# Patient Record
Sex: Female | Born: 1940 | Race: White | Hispanic: No | Marital: Married | Smoking: Current every day smoker
Health system: Southern US, Community
[De-identification: ages and names within clinical notes are randomized; demographics above are authoritative.]

## PROBLEM LIST (undated history)

## (undated) DIAGNOSIS — F419 Anxiety disorder, unspecified: Secondary | ICD-10-CM

## (undated) DIAGNOSIS — I10 Essential (primary) hypertension: Secondary | ICD-10-CM

## (undated) DIAGNOSIS — C719 Malignant neoplasm of brain, unspecified: Secondary | ICD-10-CM

## (undated) HISTORY — PX: ABDOMINAL HYSTERECTOMY: SHX81

---

## 2004-11-19 ENCOUNTER — Ambulatory Visit: Payer: Self-pay

## 2005-01-25 ENCOUNTER — Emergency Department: Payer: Self-pay | Admitting: Emergency Medicine

## 2005-09-07 ENCOUNTER — Ambulatory Visit: Payer: Self-pay | Admitting: Family Medicine

## 2005-12-24 ENCOUNTER — Ambulatory Visit: Payer: Self-pay | Admitting: Family Medicine

## 2006-09-09 ENCOUNTER — Other Ambulatory Visit: Payer: Self-pay

## 2006-09-09 ENCOUNTER — Emergency Department: Payer: Self-pay

## 2007-01-16 ENCOUNTER — Emergency Department: Payer: Self-pay | Admitting: Emergency Medicine

## 2007-07-24 ENCOUNTER — Other Ambulatory Visit: Payer: Self-pay

## 2007-07-24 ENCOUNTER — Observation Stay: Payer: Self-pay | Admitting: Internal Medicine

## 2007-10-30 ENCOUNTER — Emergency Department: Payer: Self-pay

## 2007-11-18 ENCOUNTER — Ambulatory Visit: Payer: Self-pay | Admitting: Family Medicine

## 2008-03-07 ENCOUNTER — Ambulatory Visit: Payer: Self-pay | Admitting: Gastroenterology

## 2008-03-25 ENCOUNTER — Emergency Department: Payer: Self-pay | Admitting: Emergency Medicine

## 2008-09-13 ENCOUNTER — Ambulatory Visit: Payer: Self-pay | Admitting: Rheumatology

## 2008-09-18 ENCOUNTER — Ambulatory Visit: Payer: Self-pay | Admitting: Internal Medicine

## 2008-10-05 ENCOUNTER — Ambulatory Visit: Payer: Self-pay | Admitting: Internal Medicine

## 2008-10-09 ENCOUNTER — Ambulatory Visit: Payer: Self-pay | Admitting: Internal Medicine

## 2008-10-18 ENCOUNTER — Ambulatory Visit: Payer: Self-pay | Admitting: Internal Medicine

## 2008-11-06 ENCOUNTER — Ambulatory Visit: Payer: Self-pay | Admitting: Cardiothoracic Surgery

## 2008-11-18 ENCOUNTER — Ambulatory Visit: Payer: Self-pay | Admitting: Internal Medicine

## 2008-12-19 ENCOUNTER — Ambulatory Visit: Payer: Self-pay | Admitting: Internal Medicine

## 2009-01-06 ENCOUNTER — Emergency Department: Payer: Self-pay | Admitting: Emergency Medicine

## 2009-06-11 ENCOUNTER — Ambulatory Visit: Payer: Self-pay | Admitting: Cardiothoracic Surgery

## 2009-06-12 ENCOUNTER — Ambulatory Visit: Payer: Self-pay | Admitting: Internal Medicine

## 2010-04-20 ENCOUNTER — Emergency Department: Payer: Self-pay | Admitting: Emergency Medicine

## 2011-01-18 ENCOUNTER — Emergency Department: Payer: Self-pay | Admitting: Emergency Medicine

## 2011-01-22 ENCOUNTER — Ambulatory Visit: Payer: Self-pay | Admitting: Family Medicine

## 2012-05-12 ENCOUNTER — Emergency Department: Payer: Self-pay | Admitting: Emergency Medicine

## 2012-11-28 ENCOUNTER — Emergency Department: Payer: Self-pay | Admitting: Emergency Medicine

## 2013-05-06 ENCOUNTER — Observation Stay: Payer: Self-pay | Admitting: Internal Medicine

## 2013-05-06 LAB — BASIC METABOLIC PANEL
ANION GAP: 6 — AB (ref 7–16)
BUN: 18 mg/dL (ref 7–18)
CREATININE: 0.78 mg/dL (ref 0.60–1.30)
Calcium, Total: 9.6 mg/dL (ref 8.5–10.1)
Chloride: 104 mmol/L (ref 98–107)
Co2: 28 mmol/L (ref 21–32)
EGFR (Non-African Amer.): >60
Glucose: 142 mg/dL — ABNORMAL HIGH (ref 65–99)
Osmolality: 280 (ref 275–301)
Potassium: 3.6 mmol/L (ref 3.5–5.1)
Sodium: 138 mmol/L (ref 136–145)

## 2013-05-06 LAB — TROPONIN I
Troponin-I: <0.02 ng/mL
Troponin-I: <0.02 ng/mL

## 2013-05-06 LAB — CBC
HCT: 38.4 % (ref 35.0–47.0)
HGB: 12.6 g/dL (ref 12.0–16.0)
MCH: 28.6 pg (ref 26.0–34.0)
MCHC: 32.9 g/dL (ref 32.0–36.0)
MCV: 87 fL (ref 80–100)
Platelet: 276 10*3/uL (ref 150–440)
RBC: 4.42 10*6/uL (ref 3.80–5.20)
RDW: 15.1 % — ABNORMAL HIGH (ref 11.5–14.5)
WBC: 9.8 10*3/uL (ref 3.6–11.0)

## 2013-05-07 LAB — LIPID PANEL
Cholesterol: 168 mg/dL (ref 0–200)
HDL Cholesterol: 45 mg/dL (ref 40–60)
Ldl Cholesterol, Calc: 98 mg/dL (ref 0–100)
TRIGLYCERIDES: 126 mg/dL (ref 0–200)
VLDL CHOLESTEROL, CALC: 25 mg/dL (ref 5–40)

## 2013-05-07 LAB — TSH: THYROID STIMULATING HORM: 2.75 u[IU]/mL

## 2013-11-22 ENCOUNTER — Emergency Department: Payer: Self-pay | Admitting: Emergency Medicine

## 2013-11-22 LAB — BASIC METABOLIC PANEL
ANION GAP: 7 (ref 7–16)
BUN: 17 mg/dL (ref 7–18)
CO2: 27 mmol/L (ref 21–32)
Calcium, Total: 9.1 mg/dL (ref 8.5–10.1)
Chloride: 108 mmol/L — ABNORMAL HIGH (ref 98–107)
Creatinine: 0.69 mg/dL (ref 0.60–1.30)
EGFR (African American): >60
EGFR (Non-African Amer.): >60
Glucose: 88 mg/dL (ref 65–99)
Osmolality: 284 (ref 275–301)
Potassium: 3.6 mmol/L (ref 3.5–5.1)
SODIUM: 142 mmol/L (ref 136–145)

## 2013-11-22 LAB — CBC
HCT: 39.2 % (ref 35.0–47.0)
HGB: 12.4 g/dL (ref 12.0–16.0)
MCH: 29.2 pg (ref 26.0–34.0)
MCHC: 31.8 g/dL — ABNORMAL LOW (ref 32.0–36.0)
MCV: 92 fL (ref 80–100)
Platelet: 223 10*3/uL (ref 150–440)
RBC: 4.26 10*6/uL (ref 3.80–5.20)
RDW: 14.8 % — AB (ref 11.5–14.5)
WBC: 7.7 10*3/uL (ref 3.6–11.0)

## 2013-11-22 LAB — TROPONIN I: Troponin-I: <0.02 ng/mL

## 2013-11-23 LAB — TROPONIN I: Troponin-I: <0.02 ng/mL

## 2014-03-24 ENCOUNTER — Emergency Department: Payer: Self-pay | Admitting: Emergency Medicine

## 2014-04-29 ENCOUNTER — Emergency Department: Payer: Self-pay | Admitting: Emergency Medicine

## 2014-04-29 LAB — CBC
HCT: 35.2 % (ref 35.0–47.0)
HGB: 11.4 g/dL — AB (ref 12.0–16.0)
MCH: 30.2 pg (ref 26.0–34.0)
MCHC: 32.5 g/dL (ref 32.0–36.0)
MCV: 93 fL (ref 80–100)
Platelet: 265 10*3/uL (ref 150–440)
RBC: 3.78 10*6/uL — ABNORMAL LOW (ref 3.80–5.20)
RDW: 13.3 % (ref 11.5–14.5)
WBC: 6.4 10*3/uL (ref 3.6–11.0)

## 2014-04-29 LAB — COMPREHENSIVE METABOLIC PANEL
ALBUMIN: 3.6 g/dL (ref 3.4–5.0)
ANION GAP: 7 (ref 7–16)
Alkaline Phosphatase: 48 U/L
BILIRUBIN TOTAL: 0.2 mg/dL (ref 0.2–1.0)
BUN: 16 mg/dL (ref 7–18)
CALCIUM: 9.3 mg/dL (ref 8.5–10.1)
CREATININE: 0.61 mg/dL (ref 0.60–1.30)
Chloride: 106 mmol/L (ref 98–107)
Co2: 29 mmol/L (ref 21–32)
EGFR (African American): >60
EGFR (Non-African Amer.): >60
GLUCOSE: 117 mg/dL — AB (ref 65–99)
OSMOLALITY: 285 (ref 275–301)
POTASSIUM: 4 mmol/L (ref 3.5–5.1)
SGOT(AST): 28 U/L (ref 15–37)
SGPT (ALT): 16 U/L
SODIUM: 142 mmol/L (ref 136–145)
TOTAL PROTEIN: 7.2 g/dL (ref 6.4–8.2)

## 2014-04-29 LAB — LIPASE, BLOOD: LIPASE: 54 U/L — AB (ref 73–393)

## 2014-04-29 LAB — URINALYSIS, COMPLETE
Bacteria: NONE SEEN
Bilirubin,UR: NEGATIVE
Blood: NEGATIVE
Glucose,UR: NEGATIVE mg/dL (ref 0–75)
KETONE: NEGATIVE
Leukocyte Esterase: NEGATIVE
Nitrite: NEGATIVE
PROTEIN: NEGATIVE
Ph: 6 (ref 4.5–8.0)
RBC,UR: 1 /HPF (ref 0–5)
SPECIFIC GRAVITY: 1.014 (ref 1.003–1.030)
Squamous Epithelial: <1

## 2014-04-29 LAB — TROPONIN I

## 2014-07-02 ENCOUNTER — Ambulatory Visit: Payer: Self-pay | Admitting: Ophthalmology

## 2014-08-11 NOTE — Discharge Summary (Signed)
PATIENT NAME:  Sheena Holloway, Sheena Holloway MR#:  [id number] DATE OF BIRTH:  Oct 07, 1940  DATE OF ADMISSION:  05/06/2013 DATE OF DISCHARGE:  05/07/2013  DISCHARGE DIAGNOSES: 1.    Chest pain, troponin and telemetry, ruled out coronary artery disease.  2.   Patient felt nerve root compression and frequent pains in the back. Advised to see orthopedic doctor.  3.   Hypertension.   CONDITION ON DISCHARGE: Stable.   CODE STATUS: Full code.   MEDICATIONS: Atenolol 50 mg once a day.   DIET: Advised to have low-sodium, low-cholesterol diet on discharge:   ACTIVITY: As tolerated.   FOLLOWUP: Follow up within 2 to 4 weeks; see  primary care physician, Dr. Delight Stare.  HISTORY OF PRESENT ILLNESS: A 74 year old Caucasian female with history of anxiety, depression, hypertension, kidney stones, presented to Emergency Room with chest pain. She had her pain radiation to the left side of her chest, intermittent 5 out of 10. She denied any nausea, vomiting, diaphoresis, no palpitations, orthopnea, nocturnal dyspnea or no leg edema. Her EKG showed T-wave inversions and so admitted for observation.   HOSPITAL COURSE AND STAY:  She remained pain-free in the hospital with some dull pain, which was radiating from her back towards the front of the chest and she said that she had pinched nerve and this type of pain was chronic to her. She was scared on the previous day and came to the Emergency Room. Now, she was comfortable on the next day during my examination. Her troponin remained negative x 3 and on telemetry there was no event, so I advised her to follow with orthopedic doctor for her pinched nerve pain and continue her atenolol for her hypertension. Smoking cessation counseling was also done.    IMPORTANT LAB RESULTS IN THE HOSPITAL:  Troponin remained less than 0.02 x 3.  Hemoglobin was 12.6, creatinine was 0.78.   Total time spent on this discharge:  40 minutes.   ____________________________ Ceasar Lund  Anselm Jungling, MD vgv:NTS D: 05/11/2013 76:28:31 ET T: 05/12/2013 01:19:03 ET JOB#: [id number]  cc: Ceasar Lund. Anselm Jungling, MD, <Dictator> Marguerita Merles, MD Vaughan Basta MD ELECTRONICALLY SIGNED 05/12/2013 18:55

## 2014-08-11 NOTE — H&P (Signed)
PATIENT NAME:  Sheena Holloway, NAZARIO MR#:  [id number] DATE OF BIRTH:  November 06, 1940  DATE OF ADMISSION:  05/06/2013  PRIMARY CARE PHYSICIAN:  Dr. Lennox Grumbles.  REFERRING PHYSICIAN:  Dr. Joni Fears.  CHIEF COMPLAINT:  Chest pain, one day.   HISTORY OF PRESENT ILLNESS:  A 74 year old Caucasian female with a history of anxiety, depression, hypertension, kidney stone presented in the ED with chest pain one day.  Actually, the patient has chest pain for a long time, but worsening for the past one day.  The patient said her chest pain is coming from the back.  She said that she has back pain radiation to the left side of the chest, intermittent, 5 out of 10, but the patient denies any nausea, vomiting, diaphoresis.  No palpitations, orthopnea, orthopnea nocturnal dyspnea.  No leg edema.  The patient's EKG showed T wave inversions.  Dr. Joni Fears admitted the patient for observation.  The patient denies any other symptoms.   PAST MEDICAL HISTORY:  Hypertension, kidney stone, anxiety, depression.   SOCIAL HISTORY:  Smokes 1/2 pack a day to 1 pack a day for 40 years.  Denies any alcohol drinking or illicit drugs.   PAST SURGICAL HISTORY:  Thymoma surgery, hysterectomy.   FAMILY HISTORY:  Hypertension.   ALLERGIES:  No.   HOME MEDICATIONS:   Atenolol 50 mg by mouth daily.   REVIEW OF SYSTEMS:  CONSTITUTIONAL:  The patient denies any fever or chills.  No headache or dizziness.  No weakness.   EYES:  No double vision, blurred vision.  EARS, NOSE, THROAT:  No postnasal drip, slurred speech or dysphagia.  CARDIOVASCULAR:  Positive for chest pain on the left side.  No palpitation, orthopnea, nocturnal dyspnea.  No leg edema.  PULMONARY:  No cough, sputum, shortness of breath or hemoptysis.  GASTROINTESTINAL:  No abdominal pain, nausea, vomiting or diarrhea.  No melena or bloody stool.  GENITOURINARY:  No dysuria, hematuria, or incontinence.  SKIN:  No rash or jaundice.  NEUROLOGIC:  No syncope, loss of  consciousness or seizure.  ENDOCRINE:  No polyuria, polydipsia, heat or cold intolerance.  HEMATOLOGY:  No easy bruising or bleeding.   PHYSICAL EXAMINATION: VITAL SIGNS:  Temperature 97.7, blood pressure 140/62, pulse 58, respirations 18, O2 saturation 100% on room air.  GENERAL:  The patient is alert, awake, oriented, in no acute distress.  HEENT:  Pupils round, equal and reactive to light and accommodation.   NECK:  Supple.  No JVD or carotid bruit.  No lymphadenopathy.  No thyromegaly.  CARDIOVASCULAR:  S1, S2, regular rate and rhythm.  No murmurs or gallops.  PULMONARY:  Bilateral air entry.  No wheezing or rales.  No use of accessory muscle to breathe.  Chest wall tenderness on the left side.  ABDOMEN:  Soft.  No distention.  No tenderness.  No organomegaly.  Bowel sounds present.  EXTREMITIES:  No edema, clubbing or cyanosis.  No calf tenderness.  SKIN:  No rash or jaundice.  NEUROLOGIC:  A and O x 3.  No focal deficit.  Power 5 out of 5.  Sensation intact.   LABORATORY DATA:  Troponin less than 0.02.  CBC in normal range.  Glucose 142, BUN 18, creatinine 0.78.  Electrolytes are normal.  Chest x-ray showed no evidence of pneumonia or CHF.  There was hyperinflation consistent with COPD.   IMPRESSION: 1.  Atypical chest pain, possibly due to musculoskeletal etiology.  2.  Hypertension.  3.  Abnormal EKG.  4.  Tobacco abuse.  PLAN OF TREATMENT: 1.  The patient will be placed for observation.  We will continue telemonitor, follow up a troponin level, lipid panel, TSH.  We will get a cardiology consult.  2.  We will continue atenolol.  3.  Smoking cessation was counseled.  We will give a nicotine patch and discuss the patient's condition and plan of treatment with the patient.  4.  THE PATIENT WANTS FULL CODE.   TIME SPENT:  About 52 minutes.    ____________________________ Demetrios Loll, MD qc:ea D: 05/06/2013 16:45:15 ET T: 05/06/2013 17:23:22 ET JOB#: [id number]  cc: Demetrios Loll,  MD, <Dictator> Demetrios Loll MD ELECTRONICALLY SIGNED 05/07/2013 10:44

## 2014-08-13 ENCOUNTER — Ambulatory Visit
Admit: 2014-08-13 | Disposition: A | Discharge status: Home / Self Care | Payer: Self-pay | Attending: Ophthalmology | Admitting: Ophthalmology

## 2014-08-18 ENCOUNTER — Emergency Department
Admit: 2014-08-18 | Disposition: A | Discharge status: Home / Self Care | Payer: Self-pay | Admitting: Emergency Medicine

## 2014-11-23 ENCOUNTER — Ambulatory Visit: Payer: Self-pay | Admitting: Family Medicine

## 2015-04-06 ENCOUNTER — Ambulatory Visit
Admission: EM | Admit: 2015-04-06 | Discharge: 2015-04-06 | Disposition: A | Discharge status: Home / Self Care | Payer: Medicaid Other | Attending: Internal Medicine | Admitting: Internal Medicine

## 2015-04-06 DIAGNOSIS — L508 Other urticaria: Secondary | ICD-10-CM

## 2015-04-06 HISTORY — DX: Anxiety disorder, unspecified: F41.9

## 2015-04-06 HISTORY — DX: Essential (primary) hypertension: I10

## 2015-04-06 MED ORDER — PREDNISONE 20 MG PO TABS: 20.0000 mg | ORAL_TABLET | Freq: Every day | ORAL | Status: DC

## 2015-04-06 NOTE — Discharge Instructions (Signed)
Prescription for prednisone sent to [facility]. Try taking a walk every day, to manage adrenaline.  Follow-up with your PCP, Dr. Lennox Grumbles, if not starting to improve in a week or 2.  Sometimes an allergist is helpful in managing chronic hives.  Hives Hives are itchy, red, puffy (swollen) areas of the skin. Hives can change in size and location on your body. Hives can come and go for hours, days, or weeks. Hives do not spread from person to person (noncontagious). Scratching, exercise, and stress can make your hives worse. HOME CARE  Avoid things that cause your hives (triggers).  Take antihistamine medicines as told by your doctor. Do not drive while taking an antihistamine.  Take any other medicines for itching as told by your doctor.  Wear loose-fitting clothing.  Keep all doctor visits as told. GET HELP RIGHT AWAY IF:   You have a fever.  Your tongue or lips are puffy.  You have trouble breathing or swallowing.  You feel tightness in the throat or chest.  You have belly (abdominal) pain.  You have lasting or severe itching that is not helped by medicine.  You have painful or puffy joints. These problems may be the first sign of a life-threatening allergic reaction. Call your local emergency services (911 in [place] MAKE SURE YOU:   Understand these instructions.  Will watch your condition.  Will get help right away if you are not doing well or get worse.   This information is not intended to replace advice given to you by your health care provider. Make sure you discuss any questions you have with your health care provider.   Document Released: 01/14/2008 Document Revised: 10/06/2011 Document Reviewed: 06/30/2011 Elsevier Interactive Patient Education [employer].

## 2015-04-06 NOTE — ED Provider Notes (Signed)
CSN: [id number]     Arrival date & time 04/06/15  1043 History   First MD Initiated Contact with Patient 04/06/15 1142     Chief Complaint  Patient presents with  . Urticaria    Pt reports hives "all over body" off and on x months. Has been seeing her Dr. for this but nothing has worked so far to keep them away. Unsure of allergen. Under a lot of stress.    Patient is a 74 y.o. female presenting with urticaria.  Urticaria   patient is had hives for about a month, intermittently. She is under a lot of stress taking care of her sick husband, he has cancer. She is not able to drive anymore, due to vision. She has not been walking, which used to help with stress for her. She feels like her nerves are definitely inflaming the hives. She has had one course of oral steroids, which was helpful. No respiratory symptoms, no GI distress. No fever.  Past Medical History  Diagnosis Date  . Hypertension   . Anxiety    History reviewed. No pertinent past surgical history. History reviewed. No pertinent family history. Social History  Substance Use Topics  . Smoking status: Current Every Day Smoker -- 0.50 packs/day    Types: Cigarettes  . Smokeless tobacco: None  . Alcohol Use: No    Review of Systems  All other systems reviewed and are negative.   Allergies  Review of patient's allergies indicates no known allergies.  Home Medications   Prior to Admission medications   Medication Sig Start Date End Date Taking? Authorizing Provider  LORazepam (ATIVAN) 1 MG tablet Take 1 mg by mouth at bedtime.   Yes Historical Provider, MD  metoprolol (LOPRESSOR) 50 MG tablet Take 50 mg by mouth 2 (two) times daily.   Yes Historical Provider, MD           BP 166/71 mmHg  Pulse 56  Temp(Src) 98 F (36.7 C) (Oral)  Resp 18  Ht 5\' 5"  (1.651 m)  Wt 124 lb (56.246 kg)  BMI 20.63 kg/m2  SpO2 97%   Physical Exam  Constitutional: She is oriented to person, place, and time. No distress.  Alert,  nicely groomed Scratching during exam, several sites, elbows, low back  HENT:  Head: Atraumatic.  No facial swelling  Eyes:  Conjugate gaze, no eye redness/drainage  Neck: Neck supple.  Cardiovascular: Regular rhythm.   Heart rate 50s on exam  Pulmonary/Chest: No respiratory distress.  Lungs clear, symmetric breath sounds  Abdominal: She exhibits no distension.  Musculoskeletal: Normal range of motion.  No leg swelling  Neurological: She is alert and oriented to person, place, and time.  Skin: Skin is warm and dry.  No cyanosis Several large urticarial wheals and plaques over posterior arms, mid to low back, and legs. No respiratory distress, not coughing  Nursing note and vitals reviewed.   ED Course  Procedures (including critical care time)  none   MDM   1. Urticaria, acute    Discharge Medication List as of 04/06/2015 12:03 PM    START taking these medications   Details  predniSONE (DELTASONE) 20 MG tablet Take 1 tablet (20 mg total) by mouth daily. 3 tabs qd x 3d then 2 tabs qd x 3d then 1 tab qd x 3d then 0.5 tab qd x 4d then stop., Starting 04/06/2015, Until Discontinued, Normal           Sherlene Shams, MD 04/10/15 1240

## 2015-11-05 ENCOUNTER — Encounter: Payer: Self-pay | Admitting: Emergency Medicine

## 2015-11-05 ENCOUNTER — Ambulatory Visit
Admission: EM | Admit: 2015-11-05 | Discharge: 2015-11-05 | Disposition: A | Discharge status: Home / Self Care | Payer: Medicare Other | Attending: Family Medicine | Admitting: Family Medicine

## 2015-11-05 DIAGNOSIS — F430 Acute stress reaction: Secondary | ICD-10-CM | POA: Diagnosis not present

## 2015-11-05 DIAGNOSIS — F32A Depression, unspecified: Secondary | ICD-10-CM

## 2015-11-05 DIAGNOSIS — F4320 Adjustment disorder, unspecified: Secondary | ICD-10-CM

## 2015-11-05 DIAGNOSIS — F329 Major depressive disorder, single episode, unspecified: Secondary | ICD-10-CM

## 2015-11-05 DIAGNOSIS — H3530 Unspecified macular degeneration: Secondary | ICD-10-CM | POA: Diagnosis not present

## 2015-11-05 MED ORDER — ESCITALOPRAM OXALATE 10 MG PO TABS: 10.0000 mg | ORAL_TABLET | Freq: Every day | ORAL | Status: DC

## 2015-11-05 NOTE — ED Provider Notes (Signed)
CSN: [id number]     Arrival date & time 11/05/15  1208 History   First MD Initiated Contact with Patient 11/05/15 1240   Nurses notes were reviewed.  Chief Complaint  Patient presents with  . Eye Problem   Patient came in complaining about eye problems. She has a history of macular degeneration and she is legally blind in the left eye. The right eye is 20/50 so she is not qualified to get a driver's license. She does wear glasses and is her inability to drive that is causing the problem. She reports that she is forced to drive with her husband who she feels has impairment problems with hearing. He has a history of cancer and she states she just doesn't care he drives. She states that her foster when she needs to call with him and that because of his hearing impairment at home she is basically isolated. She reports because of his unsociable tendencies she has very little interactions with people or other people coming to visit her. Because of her limitations and driving she's unable to access other people. She used to drive and is frustrated because she can't drive anymore. She states she has appointment with eye doctor next week and she is going to try them for them to try to get her condition improved so that she can drive. He states she's gotten injections in the left eye and fortunately it is only the left side has macular degeneration.  She reports not want herself but times having crying spells she wakes up very easily and does feel depressed. She reports that her PCP does not want her on medication and she is asked to be put on something to help with her nerves. She feels reasonably she does not feel good is because of her nerves.  She does smoke she has a history of hypertension anxiety she's had abdominal hysterectomy before. No pertinent family medical history pertinent to today's visit.    (Consider location/radiation/quality/duration/timing/severity/associated sxs/prior Treatment) Patient  is a 75 y.o. female presenting with eye problem and depression. The history is provided by the patient. No language interpreter was used.  Eye Problem Severity:  Moderate Timing:  Constant Progression:  Unchanged Chronicity:  Chronic Context: not burn, not chemical exposure, not direct trauma, not foreign body and not scratch   Worsened by:  Nothing tried Associated symptoms: no headaches   Depression This is a chronic problem. The problem occurs constantly. The problem has not changed since onset.Pertinent negatives include no chest pain, no abdominal pain, no headaches and no shortness of breath. Nothing aggravates the symptoms. Nothing relieves the symptoms. She has tried nothing for the symptoms. The treatment provided no relief.    Past Medical History  Diagnosis Date  . Hypertension   . Anxiety    Past Surgical History  Procedure Laterality Date  . Abdominal hysterectomy     History reviewed. No pertinent family history. Social History  Substance Use Topics  . Smoking status: Current Every Day Smoker -- 0.50 packs/day    Types: Cigarettes  . Smokeless tobacco: None  . Alcohol Use: No   OB History    No data available     Review of Systems  Respiratory: Negative for shortness of breath.   Cardiovascular: Negative for chest pain.  Gastrointestinal: Negative for abdominal pain.  Neurological: Negative for headaches.  Psychiatric/Behavioral: Positive for depression.  All other systems reviewed and are negative.   Allergies  Review of patient's allergies indicates no known allergies.  Home Medications   Prior to Admission medications   Medication Sig Start Date End Date Taking? Authorizing Provider  escitalopram (LEXAPRO) 10 MG tablet Take 1 tablet (10 mg total) by mouth daily. Start off with half a tablet for 1 week and then increase to a whole tablet daily 11/05/15   Frederich Cha, MD  LORazepam (ATIVAN) 1 MG tablet Take 1 mg by mouth at bedtime.    Historical  Provider, MD  metoprolol (LOPRESSOR) 50 MG tablet Take 50 mg by mouth 2 (two) times daily.    Historical Provider, MD  predniSONE (DELTASONE) 20 MG tablet Take 1 tablet (20 mg total) by mouth daily. 3 tabs qd x 3d then 2 tabs qd x 3d then 1 tab qd x 3d then 0.5 tab qd x 4d then stop. 04/06/15   Sherlene Shams, MD   Meds Ordered and Administered this Visit  Medications - No data to display  BP 162/50 mmHg  Pulse 66  Temp(Src) 97.2 F (36.2 C) (Tympanic)  Resp 16  Ht 5\' 4"  (1.626 m)  Wt 110 lb (49.896 kg)  BMI 18.87 kg/m2  SpO2 99% No data found.   Physical Exam  Constitutional: She appears cachectic.  Non-toxic appearance. She does not have a sickly appearance. She does not appear ill.  HENT:  Head: Normocephalic.  Eyes: Conjunctivae are normal. Pupils are equal, round, and reactive to light.  Neck: Normal range of motion.  Cardiovascular: Normal rate and regular rhythm.   Pulmonary/Chest: Effort normal and breath sounds normal.  Musculoskeletal: Normal range of motion.  Neurological: She is alert.  Skin: Skin is warm and dry.  Psychiatric: Her mood appears anxious.  Vitals reviewed.   ED Course  Procedures (including critical care time)  Labs Review Labs Reviewed - No data to display  Imaging Review No results found.   Visual Acuity Review  Right Eye Distance: 20/50 corrected Left Eye Distance: 20/200 corrected Bilateral Distance:    Right Eye Near:   Left Eye Near:    Bilateral Near:         MDM   1. Depression   2. Macular degeneration, left eye   3. Adult situational stress disorder     Since everything appears be related to the stress that she's under I've recommended that she try Lexapro since may help for appears be a depressive situation.   Discussed with patient will try her on Lexapro 10 mg half a tablet for week and if she tolerates that I recommend she go to a whole tablet daily and follow her PCP in about 4-6 weeks for is maintaining the  Lexapro medication if this helpful.  Note: This dictation was prepared with Dragon dictation along with smaller phrase technology. Any transcriptional errors that result from this process are unintentional.     Frederich Cha, MD 11/05/15 1359

## 2015-11-05 NOTE — Discharge Instructions (Signed)
Major Depressive Disorder Major depressive disorder is a mental illness. It also may be called clinical depression or unipolar depression. Major depressive disorder usually causes feelings of sadness, hopelessness, or helplessness. Some people with this disorder do not feel particularly sad but lose interest in doing things they used to enjoy (anhedonia). Major depressive disorder also can cause physical symptoms. It can interfere with work, school, relationships, and other normal everyday activities. The disorder varies in severity but is longer lasting and more serious than the sadness we all feel from time to time in our lives. Major depressive disorder often is triggered by stressful life events or major life changes. Examples of these triggers include divorce, loss of your job or home, a move, and the death of a family member or close friend. Sometimes this disorder occurs for no obvious reason at all. People who have family members with major depressive disorder or bipolar disorder are at higher risk for developing this disorder, with or without life stressors. Major depressive disorder can occur at any age. It may occur just once in your life (single episode major depressive disorder). It may occur multiple times (recurrent major depressive disorder). SYMPTOMS People with major depressive disorder have either anhedonia or depressed mood on nearly a daily basis for at least 2 weeks or longer. Symptoms of depressed mood include:  Feelings of sadness (blue or down in the dumps) or emptiness.  Feelings of hopelessness or helplessness.  Tearfulness or episodes of crying (may be observed by others).  Irritability (children and adolescents). In addition to depressed mood or anhedonia or both, people with this disorder have at least four of the following symptoms:  Difficulty sleeping or sleeping too much.   Significant change (increase or decrease) in appetite or weight.   Lack of energy or  motivation.  Feelings of guilt and worthlessness.   Difficulty concentrating, remembering, or making decisions.  Unusually slow movement (psychomotor retardation) or restlessness (as observed by others).   Recurrent wishes for death, recurrent thoughts of self-harm (suicide), or a suicide attempt. People with major depressive disorder commonly have persistent negative thoughts about themselves, other people, and the world. People with severe major depressive disorder may experiencedistorted beliefs or perceptions about the world (psychotic delusions). They also may see or hear things that are not real (psychotic hallucinations). DIAGNOSIS Major depressive disorder is diagnosed through an assessment by your health care provider. Your health care provider will ask aboutaspects of your daily life, such as mood,sleep, and appetite, to see if you have the diagnostic symptoms of major depressive disorder. Your health care provider may ask about your medical history and use of alcohol or drugs, including prescription medicines. Your health care provider also may do a physical exam and blood work. This is because certain medical conditions and the use of certain substances can cause major depressive disorder-like symptoms (secondary depression). Your health care provider also may refer you to a mental health specialist for further evaluation and treatment. TREATMENT It is important to recognize the symptoms of major depressive disorder and seek treatment. The following treatments can be prescribed for this disorder:   Medicine. Antidepressant medicines usually are prescribed. Antidepressant medicines are thought to correct chemical imbalances in the brain that are commonly associated with major depressive disorder. Other types of medicine may be added if the symptoms do not respond to antidepressant medicines alone or if psychotic delusions or hallucinations occur.  Talk therapy. Talk therapy can be  helpful in treating major depressive disorder by providing   support, education, and guidance. Certain types of talk therapy also can help with negative thinking (cognitive behavioral therapy) and with relationship issues that trigger this disorder (interpersonal therapy). A mental health specialist can help determine which treatment is best for you. Most people with major depressive disorder do well with a combination of medicine and talk therapy. Treatments involving electrical stimulation of the brain can be used in situations with extremely severe symptoms or when medicine and talk therapy do not work over time. These treatments include electroconvulsive therapy, transcranial magnetic stimulation, and vagal nerve stimulation.   This information is not intended to replace advice given to you by your health care provider. Make sure you discuss any questions you have with your health care provider.   Document Released: 08/01/2012 Document Revised: 04/27/2014 Document Reviewed: 08/01/2012 Elsevier Interactive Patient Education 2016 [employer]  

## 2015-11-05 NOTE — ED Notes (Signed)
Patient c/o blurry vision that started yesterday.  Patient reports ongoing HAs.

## 2015-12-22 ENCOUNTER — Encounter: Payer: Self-pay | Admitting: Emergency Medicine

## 2015-12-22 ENCOUNTER — Encounter: Payer: Self-pay | Admitting: *Deleted

## 2015-12-22 ENCOUNTER — Emergency Department: Payer: Medicare Other

## 2015-12-22 ENCOUNTER — Observation Stay
Admission: EM | Admit: 2015-12-22 | Discharge: 2015-12-23 | Disposition: A | Discharge status: Home / Self Care | Payer: Medicare Other | Attending: Internal Medicine | Admitting: Internal Medicine

## 2015-12-22 ENCOUNTER — Emergency Department
Admission: EM | Admit: 2015-12-22 | Discharge: 2015-12-22 | Discharge status: Against Medical Advice | Payer: Medicare Other | Source: Home / Self Care | Attending: Emergency Medicine | Admitting: Emergency Medicine

## 2015-12-22 DIAGNOSIS — R51 Headache: Principal | ICD-10-CM

## 2015-12-22 DIAGNOSIS — R233 Spontaneous ecchymoses: Secondary | ICD-10-CM

## 2015-12-22 DIAGNOSIS — I672 Cerebral atherosclerosis: Secondary | ICD-10-CM | POA: Diagnosis not present

## 2015-12-22 DIAGNOSIS — F17210 Nicotine dependence, cigarettes, uncomplicated: Secondary | ICD-10-CM

## 2015-12-22 DIAGNOSIS — Z9841 Cataract extraction status, right eye: Secondary | ICD-10-CM | POA: Insufficient documentation

## 2015-12-22 DIAGNOSIS — R519 Headache, unspecified: Secondary | ICD-10-CM

## 2015-12-22 DIAGNOSIS — Z79899 Other long term (current) drug therapy: Secondary | ICD-10-CM | POA: Insufficient documentation

## 2015-12-22 DIAGNOSIS — Z9842 Cataract extraction status, left eye: Secondary | ICD-10-CM | POA: Diagnosis not present

## 2015-12-22 DIAGNOSIS — I10 Essential (primary) hypertension: Secondary | ICD-10-CM

## 2015-12-22 DIAGNOSIS — I639 Cerebral infarction, unspecified: Secondary | ICD-10-CM | POA: Diagnosis present

## 2015-12-22 DIAGNOSIS — F419 Anxiety disorder, unspecified: Secondary | ICD-10-CM | POA: Diagnosis not present

## 2015-12-22 DIAGNOSIS — H538 Other visual disturbances: Secondary | ICD-10-CM

## 2015-12-22 DIAGNOSIS — Z90710 Acquired absence of both cervix and uterus: Secondary | ICD-10-CM | POA: Diagnosis not present

## 2015-12-22 DIAGNOSIS — G459 Transient cerebral ischemic attack, unspecified: Secondary | ICD-10-CM

## 2015-12-22 LAB — COMPREHENSIVE METABOLIC PANEL
ALBUMIN: 4 g/dL (ref 3.5–5.0)
ALK PHOS: 35 U/L — AB (ref 38–126)
ALT: 13 U/L — AB (ref 14–54)
ANION GAP: 6 (ref 5–15)
AST: 20 U/L (ref 15–41)
BUN: 21 mg/dL — AB (ref 6–20)
CALCIUM: 9.7 mg/dL (ref 8.9–10.3)
CO2: 28 mmol/L (ref 22–32)
CREATININE: 0.7 mg/dL (ref 0.44–1.00)
Chloride: 103 mmol/L (ref 101–111)
GFR calc Af Amer: >60 mL/min (ref >60)
GFR calc non Af Amer: >60 mL/min (ref >60)
GLUCOSE: 111 mg/dL — AB (ref 65–99)
Potassium: 3.7 mmol/L (ref 3.5–5.1)
Sodium: 137 mmol/L (ref 135–145)
TOTAL PROTEIN: 7.5 g/dL (ref 6.5–8.1)

## 2015-12-22 LAB — DIFFERENTIAL
Basophils Absolute: 0 10*3/uL (ref 0–0.1)
Basophils Relative: 0 %
EOS PCT: 0 %
Eosinophils Absolute: 0 10*3/uL (ref 0–0.7)
LYMPHS ABS: 1.8 10*3/uL (ref 1.0–3.6)
LYMPHS PCT: 24 %
Monocytes Absolute: 0.4 10*3/uL (ref 0.2–0.9)
Monocytes Relative: 6 %
NEUTROS ABS: 5.3 10*3/uL (ref 1.4–6.5)
NEUTROS PCT: 70 %

## 2015-12-22 LAB — PROTIME-INR
INR: 1.07
Prothrombin Time: 13.9 seconds (ref 11.4–15.2)

## 2015-12-22 LAB — CBC
HCT: 35.2 % (ref 35.0–47.0)
HEMOGLOBIN: 11.9 g/dL — AB (ref 12.0–16.0)
MCH: 29.6 pg (ref 26.0–34.0)
MCHC: 33.9 g/dL (ref 32.0–36.0)
MCV: 87.4 fL (ref 80.0–100.0)
Platelets: 255 10*3/uL (ref 150–440)
RBC: 4.02 MIL/uL (ref 3.80–5.20)
RDW: 13.9 % (ref 11.5–14.5)
WBC: 7.6 10*3/uL (ref 3.6–11.0)

## 2015-12-22 LAB — APTT: aPTT: 43 seconds — ABNORMAL HIGH (ref 24–36)

## 2015-12-22 MED ORDER — HYDRALAZINE HCL 20 MG/ML IJ SOLN
2.0000 mg | Freq: Once | INTRAMUSCULAR | Status: AC
  Administered 2015-12-22: 2 mg via INTRAVENOUS
  Filled 2015-12-22: qty 1

## 2015-12-22 MED ORDER — ONDANSETRON HCL 4 MG/2ML IJ SOLN
4.0000 mg | Freq: Once | INTRAMUSCULAR | Status: AC
  Administered 2015-12-22: 4 mg via INTRAVENOUS
  Filled 2015-12-22: qty 2

## 2015-12-22 MED ORDER — NICARDIPINE HCL IN NACL 20-0.86 MG/200ML-% IV SOLN
3.0000 mg/h | Freq: Once | INTRAVENOUS | Status: AC
  Administered 2015-12-22: 5 mg/h via INTRAVENOUS
  Filled 2015-12-22 (×2): qty 200

## 2015-12-22 MED ORDER — MORPHINE SULFATE (PF) 4 MG/ML IV SOLN
4.0000 mg | Freq: Once | INTRAVENOUS | Status: AC
  Administered 2015-12-22: 4 mg via INTRAVENOUS
  Filled 2015-12-22: qty 1

## 2015-12-22 MED ORDER — HYDRALAZINE HCL 20 MG/ML IJ SOLN: 5.0000 mg | Freq: Once | INTRAMUSCULAR | Status: DC

## 2015-12-22 NOTE — ED Notes (Signed)
MD at bedside, pt states she will come back today to be seen but has to go home to take care of something

## 2015-12-22 NOTE — ED Provider Notes (Signed)
[facility] Emergency Department Provider Note   ____________________________________________   First MD Initiated Contact with Patient 12/22/15 2135     (approximate)  I have reviewed the triage vital signs and the nursing notes.   HISTORY  Chief Complaint Visual Field Change    HPI Sheena Holloway is a 75 y.o. female has a history of headaches, had a recurrent throbbing headache yesterday associated with loss of slight vision over her right eye for the last 24 hours.  She returns now, having been seen earlier. She reports no new changes, no speech change, her headache has improved but she reports still having trouble as though she can't see things well out of the corner of her right eye. No pain in the eye. 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    Past Medical History:  Diagnosis Date  . Anxiety   . Hypertension     There are no active problems to display for this patient.   Past Surgical History:  Procedure Laterality Date  . ABDOMINAL HYSTERECTOMY      Prior to Admission medications   Medication Sig Start Date End Date Taking? Authorizing Provider  metoprolol (LOPRESSOR) 50 MG tablet Take 50 mg by mouth 2 (two) times daily.   Yes Historical Provider, MD    Allergies Review of patient's allergies indicates no known allergies.  History reviewed. No pertinent family history.  Social History Social History  Substance Use Topics  . Smoking status: Current Every Day Smoker    Packs/day: 0.50    Types: Cigarettes  . Smokeless tobacco: Never Used  . Alcohol use No    Review of Systems Constitutional: No fever/chills Eyes: See history of present illness ENT: No sore throat. Cardiovascular: Denies chest pain. Respiratory: Denies shortness of breath. Gastrointestinal: No abdominal pain.  No nausea, no vomiting.  No diarrhea.  No constipation. Genitourinary: Negative for  dysuria. Musculoskeletal: Negative for back pain. Skin: Negative for rash. Neurological: Negative for focal weakness or numbness.  10-point ROS otherwise negative.  ____________________________________________   PHYSICAL EXAM:  VITAL SIGNS: ED Triage Vitals  Enc Vitals Group     BP 12/22/15 2050 (!) 171/56     Pulse Rate 12/22/15 2050 (!) 56     Resp 12/22/15 2050 18     Temp 12/22/15 2050 98.6 F (37 C)     Temp Source 12/22/15 2050 Oral     SpO2 12/22/15 2050 98 %     Weight --      Height --      Head Circumference --      Peak Flow --      Pain Score 12/22/15 2051 7     Pain Loc --      Pain Edu? --      Excl. in [place]? --     Constitutional: Alert and oriented. Well appearing and in no acute distress. Eyes: Conjunctivae are normal. PERRL. EOMI.There is mild loss noted in the right lower visual field. Patient reports fuzzy and focusing on left eye but reports is baseline. No retinal hemorrhage noted on ophthalmologic examination of the right eye. The right eye conjunctiva is normal, she denies pain in the eye and there is no injection. Head: Atraumatic. Nose: No congestion/rhinnorhea. Mouth/Throat: Mucous membranes are moist.  Oropharynx non-erythematous. Neck: No stridor.   Cardiovascular: Normal rate, regular rhythm. Grossly normal heart sounds.  Good peripheral circulation. Respiratory: Normal respiratory effort.  No retractions.  Lungs CTAB. Gastrointestinal: Soft and nontender. No distention.  Musculoskeletal: No lower extremity tenderness nor edema.  No joint effusions. Neurologic:  Normal speech and language. No gross focal neurologic deficits are appreciated. No gait instability. Skin:  Skin is warm, dry and intact. No rash noted. Psychiatric: Mood and affect are normal. Speech and behavior are normal.  ____________________________________________   LABS (all labs ordered are listed, but only abnormal results are displayed)  Labs Reviewed - No data to  display ____________________________________________  EKG   ____________________________________________  RADIOLOGY  Ct Head Wo Contrast  Result Date: 12/22/2015 CLINICAL DATA:  75 year old female with headache and confusion for 2 days. EXAM: CT HEAD WITHOUT CONTRAST TECHNIQUE: Contiguous axial images were obtained from the base of the skull through the vertex without intravenous contrast. COMPARISON:  01/22/2011 CT FINDINGS: Brain: Apparent loss of gray-white differentiation within the medial left occipitoparietal region noted with associated slightly hyperdense 6 mm structure (image 18). MRI recommended for further evaluation. There is no evidence of midline shift, hydrocephalus or extra-axial collection. Vascular: No hyperdense vessel noted. Mild intracranial atherosclerosis noted. Skull: Unremarkable Sinuses/Orbits: Visualized portions unremarkable Other: None IMPRESSION: Apparent loss of gray-white differentiation in the medial left occipitoparietal region with slightly hyperdense structure. This may represent an infarct with petechial hemorrhage but MRI is recommended for further evaluation. Electronically Signed   By: Margarette Canada M.D.   On: 12/22/2015 13:32    ____________________________________________   PROCEDURES  Procedure(s) performed: None  Procedures  Critical Care performed: No  ____________________________________________   INITIAL IMPRESSION / ASSESSMENT AND PLAN / ED COURSE  Pertinent labs & imaging results that were available during my care of the patient were reviewed by me and considered in my medical decision making (see chart for details).  Patient presents for reevaluation after leaving earlier. CT discussed with Dr. Irish Elders of neurology, given the patient's symptoms and CT findings very concerning for a likely infarct with now microhemorrhage. Dr. Irish Elders recommends no aspirin or anticoagulant for the next 48 hours, but does advise repeat CT scan in the  morning. We will admit the patient for concerns of a ischemic stroke, she took 2 aspirin yesterday, and we will not give further this time due to microhemorrhage noted. Patient agreeable with the plan for admission. Blood pressure improved after giving small dose of hydralazine. Dr. 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     Delman Kitten, MD 12/22/15 [phone]

## 2015-12-22 NOTE — ED Notes (Addendum)
Patient refused MRI with Delbert Phenix , MD notified

## 2015-12-22 NOTE — ED Notes (Signed)
Pt wanting to leave for family issue, MD notified.

## 2015-12-22 NOTE — ED Triage Notes (Addendum)
Pt c/o visual disturbance in R eye starting on past Thursday w/ accompanying worsening headache. Persistent headache x 9 months. Pt is walking with unsteady gait toward R side which also started this past Thursday.

## 2015-12-22 NOTE — ED Provider Notes (Signed)
[facility] Emergency Department Provider Note  ____________________________________________  Time seen: Approximately 2:03 PM  I have reviewed the triage vital signs and the nursing notes.   HISTORY  Chief Complaint Visual Field Change and Headache   HPI Sheena Holloway is a 75 y.o. female history of hypertension and anxiety who presents for evaluation of headache and changes in her vision. Patient reports for the last 9 months she has had intermittent headaches. Her current headache started yesterday evening and it was associated with blurry vision and seeing lights on the lateral aspect of her right visual field. She reports that these changes in vision or new. She reports that her vision and her left eyes terrible but that has been going on for a while and she is being followed by ophthalmology for that. She is unclear what diagnosis she carries. She currently endorses 10 out of 10 headache that she describes as pressure, diffuse. She denies nausea or vomiting, chest pain, shortness of breath, facial droop, slurred speech, difficulty finding words, numbness or weakness of her extremities. She does not take any blood thinners.  Past Medical History:  Diagnosis Date  . Anxiety   . Hypertension     There are no active problems to display for this patient.   Past Surgical History:  Procedure Laterality Date  . ABDOMINAL HYSTERECTOMY      Prior to Admission medications   Medication Sig Start Date End Date Taking? Authorizing Provider  escitalopram (LEXAPRO) 10 MG tablet Take 1 tablet (10 mg total) by mouth daily. Start off with half a tablet for 1 week and then increase to a whole tablet daily 11/05/15   Frederich Cha, MD  LORazepam (ATIVAN) 1 MG tablet Take 1 mg by mouth at bedtime.    Historical Provider, MD  metoprolol (LOPRESSOR) 50 MG tablet Take 50 mg by mouth 2 (two) times daily.    Historical Provider, MD  predniSONE (DELTASONE) 20 MG tablet Take 1  tablet (20 mg total) by mouth daily. 3 tabs qd x 3d then 2 tabs qd x 3d then 1 tab qd x 3d then 0.5 tab qd x 4d then stop. 04/06/15   Sherlene Shams, MD    Allergies Review of patient's allergies indicates no known allergies.  History reviewed. No pertinent family history.  Social History Social History  Substance Use Topics  . Smoking status: Current Every Day Smoker    Packs/day: 0.50    Types: Cigarettes  . Smokeless tobacco: Never Used  . Alcohol use No    Review of Systems  Constitutional: Negative for fever. Eyes: + blurry vision ENT: Negative for sore throat. Cardiovascular: Negative for chest pain. Respiratory: Negative for shortness of breath. Gastrointestinal: Negative for abdominal pain, vomiting or diarrhea. Genitourinary: Negative for dysuria. Musculoskeletal: Negative for back pain. Skin: Negative for rash. Neurological: Negative for weakness or numbness. + HA  ____________________________________________   PHYSICAL EXAM:  VITAL SIGNS: ED Triage Vitals  Enc Vitals Group     BP 12/22/15 1239 (!) 189/51     Pulse Rate 12/22/15 1239 (!) 53     Resp --      Temp 12/22/15 1239 98.2 F (36.8 C)     Temp Source 12/22/15 1239 Oral     SpO2 12/22/15 1239 97 %     Weight 12/22/15 1240 115 lb (52.2 kg)     Height 12/22/15 1240 5\' 5"  (1.651 m)     Head Circumference --  Peak Flow --      Pain Score 12/22/15 1241 9     Pain Loc --      Pain Edu? --      Excl. in [place]? --     Constitutional: Alert and oriented. Well appearing and in no apparent distress. HEENT:      Head: Normocephalic and atraumatic.         Eyes: Conjunctivae are normal. Sclera is non-icteric. EOMI. PERRL      Mouth/Throat: Mucous membranes are moist.       Neck: Supple with no signs of meningismus. Cardiovascular: Regular rate and rhythm. No murmurs, gallops, or rubs. 2+ symmetrical distal pulses are present in all extremities. No JVD. Respiratory: Normal respiratory effort. Lungs  are clear to auscultation bilaterally. No wheezes, crackles, or rhonchi.  Gastrointestinal: Soft, non tender, and non distended with positive bowel sounds. No rebound or guarding. Genitourinary: No CVA tenderness. Musculoskeletal: Nontender with normal range of motion in all extremities. No edema, cyanosis, or erythema of extremities. Neurologic: Normal speech and language. A & O x3, PERRL, no nystagmus, right visual field defect on left eye, normal visual fields on the R eye, CN III-XII intact, motor testing reveals good tone and bulk throughout. There is no evidence of pronator drift or dysmetria. Muscle strength is 5/5 throughout. Deep tendon reflexes are 2+ throughout with downgoing toes. Sensory examination is intact. Gait is normal. Skin: Skin is warm, dry and intact. No rash noted. Psychiatric: Mood and affect are normal. Speech and behavior are normal.  ____________________________________________   LABS (all labs ordered are listed, but only abnormal results are displayed)  Labs Reviewed  APTT - Abnormal; Notable for the following:       Result Value   aPTT 43 (*)    All other components within normal limits  CBC - Abnormal; Notable for the following:    Hemoglobin 11.9 (*)    All other components within normal limits  COMPREHENSIVE METABOLIC PANEL - Abnormal; Notable for the following:    Glucose, Bld 111 (*)    BUN 21 (*)    ALT 13 (*)    Alkaline Phosphatase 35 (*)    Total Bilirubin <0.1 (*)    All other components within normal limits  PROTIME-INR  DIFFERENTIAL   ____________________________________________  EKG   ED ECG REPORT I, Rudene Re, the attending physician, personally viewed and interpreted this ECG.  Sinus bradycardia, rate of 52, normal intervals, normal axis, T-wave inversions on inferior and lateral leads. Unchanged from August 2015 ____________________________________________  RADIOLOGY  Head CT:  Apparent loss of gray-white  differentiation in the medial left occipitoparietal region with slightly hyperdense structure. This may represent an infarct with petechial hemorrhage but MRI is recommended for further evaluation. ____________________________________________   PROCEDURES  Procedure(s) performed: None Procedures Critical Care performed:  None ____________________________________________   INITIAL IMPRESSION / ASSESSMENT AND PLAN / ED COURSE   75 y.o. female history of hypertension and anxiety who presents for evaluation of headache and changes in her vision since last night. She is neurologically intact other then a right visual field defect in the L eye which per patient is old. Patient complaining of seeing lines in the R visual field in the R eye. CT concerning for possible petechial hemorrhage in the L occipitoparietal region. Patient blood pressure is less than 160 after receiving morphine for pain. We'll start a cart appearing drip as needed for BP above 160 for possible intracranial hemorrhage. Patient is otherwise GCS  of 15 and maintaining her airway. MRI has been ordered. Patient has been updated of the results of her CT scan and is in agreement with MRI. MRI tech paged.   Clinical Course  Comment By Time  Patient remains stable. Care transferred to Dr. Jacqualine Code. Rudene Re, MD 09/03 1501    Pertinent labs & imaging results that were available during my care of the patient were reviewed by me and considered in my medical decision making (see chart for details).    ____________________________________________   FINAL CLINICAL IMPRESSION(S) / ED DIAGNOSES  Final diagnoses:  Acute nonintractable headache, unspecified headache type      NEW MEDICATIONS STARTED DURING THIS VISIT:  Discharge Medication List as of 12/22/2015  7:07 PM       Note:  This document was prepared using Dragon voice recognition software and may include unintentional dictation errors.    Rudene Re,  MD 12/22/15 2149

## 2015-12-22 NOTE — ED Triage Notes (Signed)
Pt started seeing "silver dots" in right eye lateral field yesterday. Pt states she in weaker than normal. Pt also c/o HA that has lasted "months".

## 2015-12-22 NOTE — ED Provider Notes (Signed)
----------------------------------------- 4:19 PM on 12/22/2015 -----------------------------------------  Patient reports her headache is currently improved, she continues to note a hard to describe difficulty with vision the right eye which has persisted. She describes that she had a throbbing bifrontal headache, and took 2 aspirin tablets yesterday but no other blood thinner.  The patient is presently refusing MRI, and after shared medical decision making also discussing sedation and anxiolysis during the procedure the patient still refuses MRI. Currently waiting to talk to her husband as I did recommend transfer for further evaluation for concerns of "bleeding" in the back the brain.  ----------------------------------------- 5:57 PM on 12/22/2015 -----------------------------------------  12/22/2015 at 5:57 PM:  The patient requested to leave.  I considered this to be leaving against medical advice. I personally discussed the following with them:  1)  That they currently had a medical condition of a possible "bleeding on the brain" or a possible and "stroke" and I am concerned that they may have a life-threatening problem, potentially develop severe disability from this.    2)  My proposed course of evaluation and treatment includes, but is not limited to,  admission to the hospital, neurology consultation, and the need for close monitoring and ongoing treatment.  Benefits of staying include possible diagnosis or excluding of  or an alternative serious condition such as a brain tumor or bleeding stroke, which if identified early would lead to appropriate intervention in a timely manner lessening the burden of disability and death.  3) Risks of leaving before this had been completed include: misdiagnosis, worsening illness leading up to and including prolonged or permanent disability or death.  Specific risks pertinent, but not all inclusive, of their current medical condition include but are  not limited to death and "stroke".  Despite this they stated they wanted to leave because of a financial issue with her and husband and refused further evaluation, treatment, or admission at this time. We offered to assist her, and also obtain a social work consultation and she refused.  They appeared clinically sober, were mentating appropriately, were free from distracting injury, had adequately controlled acute pain, appeared to have intact insight, judgment, and reason, and in my opinion had the capacity to make this decision.  Specifically, they were able to verbally state back in a coherent manner their current medical condition/current diagnosis, the proposed course of evaluation and/or treatment, and the risks, benefits, and alternatives of treatment versus leaving against medical advice.   They understand that they may return to seek medical attention here at ANY time they want. The patient tells me that she will go home, take care of what she needs to and should be coming back this evening. Her husband and not her will be driving.  I strongly advised them to return to the Emergency Department immediately if they experience any new or worsening symptoms that concern them, or simply if they reconsider continued evaluation and/or treatment as previously discussed.  This would be without any repercussions, though they understand they likely will need to wait again in the Emergency Department if other patients are in front of them, rather than being brought straight back.  They understood this is another advantage of staying, but still insisted upon leaving.  I recommended they follow-up with her regular doctor and a neurologist at the earliest available opportunity/appointment for further evaluation and treatment.   The patient was discharged against medical advice.  They did not accept written discharge instructions.     Delman Kitten, MD 12/22/15 1800

## 2015-12-22 NOTE — ED Notes (Signed)
Pt refuses VS at d/c

## 2015-12-22 NOTE — ED Notes (Signed)
Keep bp 160-180; MD notified of bp 192/59

## 2015-12-23 ENCOUNTER — Observation Stay: Payer: Medicare Other

## 2015-12-23 ENCOUNTER — Observation Stay
Admit: 2015-12-23 | Discharge: 2015-12-23 | Disposition: A | Discharge status: Home / Self Care | Payer: Medicare Other | Attending: Family Medicine | Admitting: Family Medicine

## 2015-12-23 ENCOUNTER — Encounter: Payer: Self-pay | Admitting: Radiology

## 2015-12-23 DIAGNOSIS — I63332 Cerebral infarction due to thrombosis of left posterior cerebral artery: Secondary | ICD-10-CM | POA: Diagnosis not present

## 2015-12-23 DIAGNOSIS — I639 Cerebral infarction, unspecified: Secondary | ICD-10-CM | POA: Diagnosis present

## 2015-12-23 DIAGNOSIS — I672 Cerebral atherosclerosis: Secondary | ICD-10-CM | POA: Diagnosis not present

## 2015-12-23 LAB — URINALYSIS COMPLETE WITH MICROSCOPIC (ARMC ONLY)
BILIRUBIN URINE: NEGATIVE
Glucose, UA: NEGATIVE mg/dL
Hgb urine dipstick: NEGATIVE
KETONES UR: NEGATIVE mg/dL
NITRITE: NEGATIVE
PH: 7 (ref 5.0–8.0)
PROTEIN: NEGATIVE mg/dL
SPECIFIC GRAVITY, URINE: 1.008 (ref 1.005–1.030)

## 2015-12-23 LAB — TROPONIN I: Troponin I: <0.03 ng/mL (ref <0.03)

## 2015-12-23 LAB — LIPID PANEL
CHOL/HDL RATIO: 2.7 ratio
CHOLESTEROL: 151 mg/dL (ref 0–200)
HDL: 55 mg/dL (ref >40)
LDL CALC: 83 mg/dL (ref 0–99)
Triglycerides: 64 mg/dL (ref <150)
VLDL: 13 mg/dL (ref 0–40)

## 2015-12-23 LAB — HEMOGLOBIN A1C: Hgb A1c MFr Bld: 5.3 % (ref 4.0–6.0)

## 2015-12-23 MED ORDER — ATORVASTATIN CALCIUM 20 MG PO TABS: 40.0000 mg | ORAL_TABLET | Freq: Every day | ORAL | Status: DC

## 2015-12-23 MED ORDER — ACETAMINOPHEN 650 MG RE SUPP: 650.0000 mg | RECTAL | Status: DC | PRN

## 2015-12-23 MED ORDER — ASPIRIN EC 81 MG PO TBEC
81.0000 mg | DELAYED_RELEASE_TABLET | Freq: Every day | ORAL | Status: DC
  Administered 2015-12-23: 14:00:00 81 mg via ORAL
  Filled 2015-12-23: qty 1

## 2015-12-23 MED ORDER — SODIUM CHLORIDE 0.9 % IV SOLN
INTRAVENOUS | Status: DC
  Administered 2015-12-23: 03:00:00 via INTRAVENOUS

## 2015-12-23 MED ORDER — ATORVASTATIN CALCIUM 40 MG PO TABS: 40.0000 mg | ORAL_TABLET | Freq: Every day | ORAL | 2 refills | Status: AC

## 2015-12-23 MED ORDER — OXYCODONE-ACETAMINOPHEN 5-325 MG PO TABS
1.0000 | ORAL_TABLET | Freq: Four times a day (QID) | ORAL | Status: DC | PRN
  Administered 2015-12-23 (×2): 1 via ORAL
  Filled 2015-12-23 (×2): qty 1

## 2015-12-23 MED ORDER — ACETAMINOPHEN 325 MG PO TABS
650.0000 mg | ORAL_TABLET | ORAL | Status: DC | PRN
  Administered 2015-12-23: 15:00:00 650 mg via ORAL
  Filled 2015-12-23: qty 2

## 2015-12-23 MED ORDER — ASPIRIN 81 MG PO TBEC: 81.0000 mg | DELAYED_RELEASE_TABLET | Freq: Every day | ORAL | 2 refills | Status: AC

## 2015-12-23 MED ORDER — STROKE: EARLY STAGES OF RECOVERY BOOK
Freq: Once | Status: AC
  Administered 2015-12-23: 04:00:00

## 2015-12-23 MED ORDER — IOPAMIDOL (ISOVUE-370) INJECTION 76%
75.0000 mL | Freq: Once | INTRAVENOUS | Status: AC | PRN
  Administered 2015-12-23: 10:00:00 75 mL via INTRAVENOUS

## 2015-12-23 NOTE — H&P (Signed)
[place] @ [facility] Admission History and Physical Harvie Bridge, D.O.  ---------------------------------------------------------------------------------------------------------------------   PATIENT NAME: Sheena Holloway MR#: [id number] DATE OF BIRTH: 1940/07/05 DATE OF ADMISSION: 12/22/2015 PRIMARY CARE PHYSICIAN: Marguerita Merles, MD  REQUESTING/REFERRING PHYSICIAN: ED Dr. Jacqualine Code  CHIEF COMPLAINT: Chief Complaint  Patient presents with  . Visual Field Change    HISTORY OF PRESENT ILLNESS: Sheena Holloway is a 75 y.o. female with a known history of Hypertension and anxiety was in a usual state of health until last night when she reports sudden onset of vision changes, blurry vision and "seeing lights sparkling" in the lateral aspect of her right visual field. Her vision changes or associated with a headache. She states she has had severe unrelenting headaches for 9 months but her symptoms became acutely worse yesterday. She reports 10 out of 10 headache described as diffuse starting in the front and radiating down to the back of the neck. She also states that she has had chronic left vision deficits which have been managed by her ophthalmologist. CT done this afternoon is concerned for an occipital CVA with microhemorrhage. Neurology consultation was appreciated and recommendations were made for admission for stroke workup however holding anticoagulation secondary to microhemorrhage. We'll also consult did for management.  Of note patient was evaluated by the emergency department physician at 2 PM. She signed out [place] to attend to some personal matters at home and return this evening for admission. She states that on her return her symptoms are unchanged.  Otherwise there has been no change in status. Patient has been taking medication as prescribed and there has been no recent change in medication or diet.  There has been no recent illness, travel or sick  contacts.    Patient denies fevers/chills, weakness, dizziness, chest pain, shortness of breath, N/V/C/D, abdominal pain, dysuria/frequency, changes in mental status.   PAST MEDICAL HISTORY: Past Medical History:  Diagnosis Date  . Anxiety   . Hypertension       PAST SURGICAL HISTORY: Past Surgical History:  Procedure Laterality Date  . ABDOMINAL HYSTERECTOMY        SOCIAL HISTORY: Social History  Substance Use Topics  . Smoking status: Current Every Day Smoker    Packs/day: 0.50    Types: Cigarettes  . Smokeless tobacco: Never Used  . Alcohol use No      FAMILY HISTORY: History reviewed. No pertinent family history.   MEDICATIONS AT HOME: Prior to Admission medications   Medication Sig Start Date End Date Taking? Authorizing Provider  metoprolol (LOPRESSOR) 50 MG tablet Take 50 mg by mouth 2 (two) times daily.   Yes Historical Provider, MD      DRUG ALLERGIES: No Known Allergies   REVIEW OF SYSTEMS: CONSTITUTIONAL: No fever/chills, fatigue, weakness, weight gain/lossPositive headache EYES: No blurry or double vision. Positive blurry vision and vision loss in the right. ENT: No tinnitus, postnasal drip, redness or soreness of the oropharynx. RESPIRATORY: No cough, wheeze, hemoptysis, dyspnea. CARDIOVASCULAR: No chest pain, orthopnea, palpitations, syncope. GASTROINTESTINAL: No nausea, vomiting, constipation, diarrhea, abdominal pain, hematemesis, melena or hematochezia. GENITOURINARY: No dysuria or hematuria. ENDOCRINE: No polyuria or nocturia. No heat or cold intolerance. HEMATOLOGY: No anemia, bruising, bleeding. INTEGUMENTARY: No rashes, ulcers, lesions. MUSCULOSKELETAL: No arthritis, swelling, gout. NEUROLOGIC: No numbness, tingling, weakness or ataxia. No seizure-type activity. PSYCHIATRIC: No anxiety, depression, insomnia.  PHYSICAL EXAMINATION: VITAL SIGNS: Blood pressure (!) 151/53, pulse (!) 58, temperature 98.6 F (37 C), temperature source  Oral, resp. rate 17,  SpO2 97 %.  GENERAL: 75 y.o.-year-old white female patient, well-developed, well-nourished lying in the bed in no acute distress.  Pleasant and cooperative.   HEENT: Head atraumatic, normocephalic. Pupils equal, round, reactive to light and accommodation. No scleral icterus. Extraocular muscles intact. Nares are patent. Oropharynx is clear. Mucus membranes moist. NECK: Supple, full range of motion. No JVD, no bruit heard. No thyroid enlargement, no tenderness, no cervical lymphadenopathy. CHEST: Normal breath sounds bilaterally. No wheezing, rales, rhonchi or crackles. No use of accessory muscles of respiration.  No reproducible chest wall tenderness.  CARDIOVASCULAR: S1, S2 normal. No murmurs, rubs, or gallops. Cap refill <2 seconds. ABDOMEN: Soft, nontender, nondistended. No rebound, guarding, rigidity. Normoactive bowel sounds present in all four quadrants. No organomegaly or mass. EXTREMITIES: Full range of motion. No pedal edema, cyanosis, or clubbing. NEUROLOGIC: Cranial nerves II through XII are grossly intact with no focal sensorimotor deficit other vision change. Muscle strength 5/5 in all extremities. Sensation intact. Gait not checked. Right lateral lower visual field deficit. PSYCHIATRIC: The patient is alert and oriented x 3. Normal affect, mood, thought content. SKIN: Warm, dry, and intact without obvious rash, lesion, or ulcer.  LABORATORY PANEL:  CBC  Recent Labs Lab 12/22/15 1254  WBC 7.6  HGB 11.9*  HCT 35.2  PLT 255   ----------------------------------------------------------------------------------------------------------------- Chemistries  Recent Labs Lab 12/22/15 1254  NA 137  K 3.7  CL 103  CO2 28  GLUCOSE 111*  BUN 21*  CREATININE 0.70  CALCIUM 9.7  AST 20  ALT 13*  ALKPHOS 35*  BILITOT <0.1*   ------------------------------------------------------------------------------------------------------------------ Cardiac Enzymes No  results for input(s): TROPONINI in the last 168 hours. ------------------------------------------------------------------------------------------------------------------  RADIOLOGY: Ct Head Wo Contrast  Result Date: 12/22/2015 CLINICAL DATA:  75 year old female with headache and confusion for 2 days. EXAM: CT HEAD WITHOUT CONTRAST TECHNIQUE: Contiguous axial images were obtained from the base of the skull through the vertex without intravenous contrast. COMPARISON:  01/22/2011 CT FINDINGS: Brain: Apparent loss of gray-white differentiation within the medial left occipitoparietal region noted with associated slightly hyperdense 6 mm structure (image 18). MRI recommended for further evaluation. There is no evidence of midline shift, hydrocephalus or extra-axial collection. Vascular: No hyperdense vessel noted. Mild intracranial atherosclerosis noted. Skull: Unremarkable Sinuses/Orbits: Visualized portions unremarkable Other: None IMPRESSION: Apparent loss of gray-white differentiation in the medial left occipitoparietal region with slightly hyperdense structure. This may represent an infarct with petechial hemorrhage but MRI is recommended for further evaluation. Electronically Signed   By: Margarette Canada M.D.   On: 12/22/2015 13:32    EKG: Pending  IMPRESSION AND PLAN:  This is a 75 y.o. female with a history of hypertension, anxiety, headaches now being admitted with:  1. Ischemic Occipitoparietal CVA with microhemorrhage-  - Admit telemetry observation for neuro workup including: - Studies: CT and CTA in a.m. Echo, Carotids - Labs: CBC, BMP, Lipids, TFTs, A1C - Nursing: Neurochecks, O2, dysphagia screen, permissive hypertension with goal systolic between 0000000 - Consults: Neurology, PT/OT, S/S consults.  - Fluids: IVNS@75cc /hr.   - Diet: Heart healthy - Routine DVT Px: with SCDs, early ambulation  2. Chronic headaches-migraine versus cervical cephalgia. Pain control, likely outpatient  follow-up.  Code Status: Full  All the records are reviewed and case discussed with ED provider. Management plans discussed with the patient and/or family who express understanding and agree with plan of care.   TOTAL TIME TAKING CARE OF THIS PATIENT: 60 minutes.   Tamakia Porto D.O. on 12/23/2015 at 2:02 AM Between  7am to 6pm - Pager - [phone] After 6pm go to [email or web address] - [employer]reader [facility] [place] [facility] Office [phone] CC: Primary care physician; Marguerita Merles, MD     Note: This dictation was prepared with Dragon dictation along with smaller phrase technology. Any transcriptional errors that result from this process are unintentional.

## 2015-12-23 NOTE — Evaluation (Signed)
Occupational Therapy Evaluation Patient Details Name: Sheena Holloway MRN: [id number] DOB: [birth year] Today's Date: 12/23/2015    History of Present Illness Pt. is a 75 y.o. female who was admitted to [facility] with a CVA.    Clinical Impression   Pt. Is a 75 y.o. Female who was admitted to [facility] with a CVA. Pt. Has had persistent headaches for the past nine months. Pt. Has a history of left sided visual limitations. Pt. Now has visual changes in the right eyes, and is seeing silver spots in the right lower quadrant. Pt. Could benefit for OT skilled services for pt. Education about visual compensatory techniques for ADL/IADLs within her environment.  Pt. Plans to return home upon discharge with family assist.    Follow Up Recommendations  Home health OT    Equipment Recommendations       Recommendations for Other Services       Precautions / Restrictions Precautions Precautions: None Restrictions Weight Bearing Restrictions: No      Mobility Bed Mobility    Pt. Sitting at the EOB upon arrival.              Transfers  Supervision                    Balance                                            ADL Overall ADL's : Needs assistance/impaired Eating/Feeding: Independent   Grooming: Independent           Upper Body Dressing : Independent   Lower Body Dressing: Independent               Functional mobility during ADLs: Supervision/safety       Vision Vision Assessment?: Vision impaired- to be further tested in functional context   Perception     Praxis      Pertinent Vitals/Pain Pain Assessment: 0-10 Pain Score: 3  Pain Location: headache Pain Descriptors / Indicators: Aching Pain Intervention(s): Limited activity within patient's tolerance     Hand Dominance Right   Extremity/Trunk Assessment Upper Extremity Assessment Upper Extremity Assessment: Overall WFL for tasks assessed (grip strength: R: 30#, L:  30#, intact sensation, and proprioceptive awareness.)           Communication Communication Communication: No difficulties   Cognition Arousal/Alertness: Awake/alert Behavior During Therapy: WFL for tasks assessed/performed Overall Cognitive Status: Within Functional Limits for tasks assessed                     General Comments       Exercises       Shoulder Instructions      Home Living Family/patient expects to be discharged to:: Private residence Living Arrangements: Spouse/significant other Available Help at Discharge: Family Type of Home: House Home Access: Stairs to enter Technical brewer of Steps: Unable to recall Entrance Stairs-Rails: Right Home Layout: Two level (Uses only the first floor.)     Bathroom Shower/Tub: Tub/shower unit;Curtain Shower/tub characteristics: Curtain       Home Equipment: None          Prior Functioning/Environment Level of Independence: Independent        Comments: Unable to drive.    OT Diagnosis: Generalized weakness;Acute pain   OT Problem List: Impaired vision/perception;Decreased strength;Pain;Decreased activity tolerance   OT Treatment/Interventions: Self-care/ADL  training;Visual/perceptual remediation/compensation;Patient/family education;Energy conservation    OT Goals(Current goals can be found in the care plan section) Acute Rehab OT Goals Patient Stated Goal: To return home OT Goal Formulation: With patient Potential to Achieve Goals: Good  OT Frequency: Min 1X/week   Barriers to D/C:            Co-evaluation              End of Session Equipment Utilized During Treatment: Gait belt  Activity Tolerance: Patient tolerated treatment well Patient left: in bed;with bed alarm set;with call bell/phone within reach;with nursing/sitter in room (social work/case management)   Time: [id number] OT Time Calculation (min): 25 min Charges:  OT General Charges $OT Visit: 1 Procedure OT  Evaluation $OT Eval Moderate Complexity: 1 Procedure G-Codes: OT G-codes **NOT FOR INPATIENT CLASS** Functional Assessment Tool Used: Clinical judgement based on pt. current functional status. Functional Limitation: Self care Self Care Current Status [phone]): At least 1 percent but less than 20 percent impaired, limited or restricted Self Care Goal Status [id number]): At least 1 percent but less than 20 percent impaired, limited or restricted Self Care Discharge Status [phone]): 0 percent impaired, limited or restricted  Harrel Carina, MS, OTR/L 12/23/2015, 1:12 PM

## 2015-12-23 NOTE — Consult Note (Signed)
Reason for Consult: L occipital stroke  Referring Physician: Dr. Bridgett Larsson  CC: R visual field cut   HPI: Sheena Holloway is an 75 y.o. female with a known history of Hypertension and anxiety was in a usual state of health until last night when she reports sudden onset of vision changes, blurry vision Her vision changes or associated with a headache. She states she has had severe unrelenting headaches for 9 months but her symptoms became acutely worse yesterday. She had 10 out of 10 headache.  Currently HA is 2/10. 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  Pt is a daily few cigarette a day smoker.  No anti platelet therapy as does not see any doctors.      Past Medical History:  Diagnosis Date  . Anxiety   . Hypertension     Past Surgical History:  Procedure Laterality Date  . ABDOMINAL HYSTERECTOMY      History reviewed. No pertinent family history.  Social History:  reports that she has been smoking Cigarettes.  She has been smoking about 0.50 packs per day. She has never used smokeless tobacco. She reports that she does not drink alcohol or use drugs.  No Known Allergies  Medications:  Prior to Admission:  Prescriptions Prior to Admission  Medication Sig Dispense Refill Last Dose  . metoprolol (LOPRESSOR) 50 MG tablet Take 50 mg by mouth 2 (two) times daily.   12/22/2015 at Unknown time    ROS: History obtained from the patient  General ROS: negative for - chills, fatigue, fever, night sweats, weight gain or weight loss Psychological ROS: negative for - behavioral disorder, hallucinations, memory difficulties, mood swings or suicidal ideation Ophthalmic ROS: negative for - blurry vision, double vision, eye pain or loss of vision ENT ROS: negative for - epistaxis, nasal discharge, oral lesions, sore throat, tinnitus or vertigo Allergy and Immunology ROS: negative for - hives or itchy/watery eyes Hematological and Lymphatic ROS:  negative for - bleeding problems, bruising or swollen lymph nodes Endocrine ROS: negative for - galactorrhea, hair pattern changes, polydipsia/polyuria or temperature intolerance Respiratory ROS: negative for - cough, hemoptysis, shortness of breath or wheezing Cardiovascular ROS: negative for - chest pain, dyspnea on exertion, edema or irregular heartbeat Gastrointestinal ROS: negative for - abdominal pain, diarrhea, hematemesis, nausea/vomiting or stool incontinence Genito-Urinary ROS: negative for - dysuria, hematuria, incontinence or urinary frequency/urgency Musculoskeletal ROS: negative for - joint swelling or muscular weakness Neurological ROS: as noted in HPI Dermatological ROS: negative for rash and skin lesion changes  Physical Examination: Blood pressure (!) 163/61, pulse 63, temperature 98.4 F (36.9 C), temperature source Oral, resp. rate 20, height 5\' 6"  (1.676 m), weight 50.1 kg (110 lb 8 oz), SpO2 97 %.   Neurological Examination Mental Status: Alert, oriented, thought content appropriate.  Speech fluent without evidence of aphasia.  Able to follow 3 step commands without difficulty. Cranial Nerves: II: Discs flat bilaterally; Visual fields grossly normal, pupils equal, round, reactive to light and accommodation III,IV, VI: suspected R homonomous hemianopsia  V,VII: smile symmetric, facial light touch sensation normal bilaterally VIII: hearing normal bilaterally IX,X: gag reflex present XI: bilateral shoulder shrug XII: midline tongue extension Motor: Right : Upper extremity   5/5    Left:     Upper extremity   5/5  Lower extremity   5/5     Lower extremity   5/5 Tone and bulk:normal tone throughout; no atrophy noted Sensory: Pinprick and light touch intact  throughout, bilaterally Deep Tendon Reflexes: 1+ and symmetric throughout Plantars: Right: downgoing   Left: downgoing Cerebellar: normal finger-to-nose, normal rapid alternating movements and normal heel-to-shin  test Gait: normal gait and station      Laboratory Studies:   Basic Metabolic Panel:  Recent Labs Lab 12/22/15 1254  NA 137  K 3.7  CL 103  CO2 28  GLUCOSE 111*  BUN 21*  CREATININE 0.70  CALCIUM 9.7    Liver Function Tests:  Recent Labs Lab 12/22/15 1254  AST 20  ALT 13*  ALKPHOS 35*  BILITOT <0.1*  PROT 7.5  ALBUMIN 4.0   No results for input(s): LIPASE, AMYLASE in the last 168 hours. No results for input(s): AMMONIA in the last 168 hours.  CBC:  Recent Labs Lab 12/22/15 1254  WBC 7.6  NEUTROABS 5.3  HGB 11.9*  HCT 35.2  MCV 87.4  PLT 255    Cardiac Enzymes:  Recent Labs Lab 12/23/15 0309 12/23/15 0841  TROPONINI <0.03 <0.03    BNP: Invalid input(s): POCBNP  CBG: No results for input(s): GLUCAP in the last 168 hours.  Microbiology: No results found for this or any previous visit.  Coagulation Studies:  Recent Labs  12/22/15 1254  LABPROT 13.9  INR 1.07    Urinalysis:  Recent Labs Lab 12/23/15 0427  COLORURINE YELLOW*  LABSPEC 1.008  PHURINE 7.0  GLUCOSEU NEGATIVE  HGBUR NEGATIVE  BILIRUBINUR NEGATIVE  KETONESUR NEGATIVE  PROTEINUR NEGATIVE  NITRITE NEGATIVE  LEUKOCYTESUR TRACE*    Lipid Panel:     Component Value Date/Time   CHOL 151 12/23/2015 0841   CHOL 168 05/07/2013 0650   TRIG 64 12/23/2015 0841   TRIG 126 05/07/2013 0650   HDL 55 12/23/2015 0841   HDL 45 05/07/2013 0650   CHOLHDL 2.7 12/23/2015 0841   VLDL 13 12/23/2015 0841   VLDL 25 05/07/2013 0650   LDLCALC 83 12/23/2015 0841   LDLCALC 98 05/07/2013 0650    HgbA1C: No results found for: HGBA1C  Urine Drug Screen:  No results found for: LABOPIA, COCAINSCRNUR, LABBENZ, AMPHETMU, THCU, LABBARB  Alcohol Level: No results for input(s): ETH in the last 168 hours.   Imaging: Ct Head Wo Contrast  Result Date: 12/22/2015 CLINICAL DATA:  75 year old female with headache and confusion for 2 days. EXAM: CT HEAD WITHOUT CONTRAST TECHNIQUE: Contiguous  axial images were obtained from the base of the skull through the vertex without intravenous contrast. COMPARISON:  01/22/2011 CT FINDINGS: Brain: Apparent loss of gray-white differentiation within the medial left occipitoparietal region noted with associated slightly hyperdense 6 mm structure (image 18). MRI recommended for further evaluation. There is no evidence of midline shift, hydrocephalus or extra-axial collection. Vascular: No hyperdense vessel noted. Mild intracranial atherosclerosis noted. Skull: Unremarkable Sinuses/Orbits: Visualized portions unremarkable Other: None IMPRESSION: Apparent loss of gray-white differentiation in the medial left occipitoparietal region with slightly hyperdense structure. This may represent an infarct with petechial hemorrhage but MRI is recommended for further evaluation. Electronically Signed   By: Margarette Canada M.D.   On: 12/22/2015 13:32   US Carotid Bilateral (at [facility] Only)  Result Date: 12/23/2015 CLINICAL DATA:  TIA, stroke, hypertension, smoker EXAM: BILATERAL CAROTID DUPLEX ULTRASOUND TECHNIQUE: Pearline Cables scale imaging, color Doppler and duplex ultrasound were performed of bilateral carotid and vertebral arteries in the neck. COMPARISON:  None. FINDINGS: Criteria: Quantification of carotid stenosis is based on velocity parameters that correlate the residual internal carotid diameter with NASCET-based stenosis levels, using the diameter of the distal  internal carotid lumen as the denominator for stenosis measurement. The following velocity measurements were obtained: RIGHT ICA:  111/26 cm/sec CCA:  [id number] cm/sec SYSTOLIC ICA/CCA RATIO:  [id number] DIASTOLIC ICA/CCA RATIO:  3.0 ECA:  83 cm/sec LEFT ICA:  123/24 cm/sec CCA:  0000000 cm/sec SYSTOLIC ICA/CCA RATIO:  1.3 DIASTOLIC ICA/CCA RATIO:  2.0 ECA:  97 cm/sec RIGHT CAROTID ARTERY: Intimal thickening RIGHT CCA. Minimal tortuosity. Small amount of noncalcified hypoechoic plaque at RIGHT carotid bulb into proximal RIGHT ICA.  Laminar flow on color Doppler imaging. Minimal spectral broadening on waveform analysis. No high velocity jets. RIGHT VERTEBRAL ARTERY:  Patent, antegrade LEFT CAROTID ARTERY: Minimal intimal thickening LEFT CCA. Small hyperechoic plaque with minimal shadowing at LEFT carotid bulb. Additional noncalcified hypoechoic plaque at proximal LEFT ICA. Mild tortuosity. Patient a rhythmic during imaging. Laminar flow on color Doppler imaging with spectral broadening on waveform analysis. No high velocity jets. LEFT VERTEBRAL ARTERY:  Patent, antegrade IMPRESSION: Mild plaque formation at the carotid bulbs into proximal internal carotid arteries bilaterally. Velocity measurements correspond to less than 50% diameter stenoses bilaterally. Arrhythmic during imaging. Electronically Signed   By: Lavonia Dana M.D.   On: 12/23/2015 11:05   Dg Chest Port 1 View  Result Date: 12/23/2015 CLINICAL DATA:  Sudden onset blurred vision, headaches EXAM: PORTABLE CHEST 1 VIEW COMPARISON:  11/22/2013 FINDINGS: Chronic interstitial marking/emphysematous changes. Biapical pleural-parenchymal scarring, chronic. No focal consolidation. No pleural effusion or pneumothorax. The heart is normal in size. Median sternotomy. IMPRESSION: No evidence of acute cardiopulmonary disease. Electronically Signed   By: Julian Hy M.D.   On: 12/23/2015 07:12     Assessment/Plan:  75 y.o. female with a known history of Hypertension and anxiety was in a usual state of health until last night when she reports sudden onset of vision changes, blurry vision Her vision changes or associated with a headache. She states she has had severe unrelenting headaches for 9 months but her symptoms became acutely worse yesterday. She had 10 out of 10 headache.  Currently HA is 2/10. 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  Pt is a daily few cigarette a day smoker.  No anti platelet therapy as does not see any  doctors.   - CTH with ? petechial hemorrhage. Safe to start ASA 81 today - statin  - pt is claustrophobic. Refuses MRI. Repeat CTH CTA H/N ordered - 2d echo - potential driving restriction as R visual field cut - likely d/c planning - follow up as out pt, pt has not seen a physician in a long time Mariela Rex  12/23/2015, 12:16 PM

## 2015-12-23 NOTE — Care Management (Signed)
Admitted to [facility] under observation status with the diagnosis of CVA. Lives with husband, Laverna Peace [phone]). Last seen Dr. Alveta Heimlich at [facility]. Primary Care physician is Dr. Delight Stare. States she really doesn't want to see Dr. Lennox Grumbles. Offered Ms. Garriga list of physicians accepting new patients, but didn't want it at this time. Uses no aids for ambulation. No Home Health. No skilled facility. No falls. Good appetite. Takes care of all basic activities of daily living herself, doesn't drive Shelbie Ammons RN MSN [facility] [phone]

## 2015-12-23 NOTE — Discharge Summary (Addendum)
[facility] NAME: Decker Miggins    MR#:  [id number]  DATE OF BIRTH:  06/17/1940  DATE OF ADMISSION:  12/22/2015   ADMITTING PHYSICIAN: Harvie Bridge, DO  DATE OF DISCHARGE:  12/23/2015  PRIMARY CARE PHYSICIAN: Marguerita Merles, MD   ADMISSION DIAGNOSIS:  Occipital infarction ([place]) [I63.9] Cerebral infarction due to unspecified mechanism [I63.9] DISCHARGE DIAGNOSIS:  Active Problems:   CVA (cerebral infarction) Acute ischemic Occipitoparietal CVA with microhemorrhage SECONDARY DIAGNOSIS:   Past Medical History:  Diagnosis Date  . Anxiety   . Hypertension    HOSPITAL COURSE:   This is a 75 y.o. female with a history of hypertension, anxiety, headaches now being admitted with:  1. Acute ischemic Occipitoparietal CVA with microhemorrhage-  CT and CTA: no vessel occlusion. Echo pending, Carotids US: no stenosis. Safe to start ASA 81 today and statin per Neurologist,  PT/OT suggets:  HH, OT; PT: outpatient PT. Patient refused.  2. Chronic headaches-migraine versus cervical cephalgia. Pain control, outpatient follow-up.  3. HTN. Resume lopressor after discharge. 4. Tobacco abuse. Smoking cessation was counseled for 3-4 minutes.  DISCHARGE CONDITIONS:  Stable, discharge to home today. CONSULTS OBTAINED:  Treatment Team:  Leotis Pain, MD DRUG ALLERGIES:  No Known Allergies DISCHARGE MEDICATIONS:     Medication List    TAKE these medications   aspirin 81 MG EC tablet Take 1 tablet (81 mg total) by mouth daily.   atorvastatin 40 MG tablet Commonly known as:  LIPITOR Take 1 tablet (40 mg total) by mouth daily at 6 PM.   metoprolol 50 MG tablet Commonly known as:  LOPRESSOR Take 50 mg by mouth 2 (two) times daily.        DISCHARGE INSTRUCTIONS:   DIET:  Heart healthy diet DISCHARGE CONDITION:  Stable ACTIVITY:  As tolerated  If you experience worsening of your admission symptoms, develop  shortness of breath, life threatening emergency, suicidal or homicidal thoughts you must seek medical attention immediately by calling 911 or calling your MD immediately  if symptoms less severe.  You Must read complete instructions/literature along with all the possible adverse reactions/side effects for all the Medicines you take and that have been prescribed to you. Take any new Medicines after you have completely understood and accpet all the possible adverse reactions/side effects.   Please note  You were cared for by a hospitalist during your hospital stay. If you have any questions about your discharge medications or the care you received while you were in the hospital after you are discharged, you can call the unit and asked to speak with the hospitalist on call if the hospitalist that took care of you is not available. Once you are discharged, your primary care physician will handle any further medical issues. Please note that NO REFILLS for any discharge medications will be authorized once you are discharged, as it is imperative that you return to your primary care physician (or establish a relationship with a primary care physician if you do not have one) for your aftercare needs so that they can reassess your need for medications and monitor your lab values.    On the day of Discharge:  VITAL SIGNS:  Blood pressure (!) 155/61, pulse (!) 53, temperature 98.3 F (36.8 C), temperature source Oral, resp. rate 20, height 5\' 6"  (1.676 m), weight 110 lb 8 oz (50.1 kg), SpO2 97 %. PHYSICAL EXAMINATION:  GENERAL:  75 y.o.-year-old patient lying in the bed with no  acute distress.  EYES: Pupils equal, round, reactive to light and accommodation. No scleral icterus. Extraocular muscles intact.  HEENT: Head atraumatic, normocephalic. Oropharynx and nasopharynx clear.  NECK:  Supple, no jugular venous distention. No thyroid enlargement, no tenderness.  LUNGS: Normal breath sounds bilaterally, no  wheezing, rales,rhonchi or crepitation. No use of accessory muscles of respiration.  CARDIOVASCULAR: S1, S2 normal. No murmurs, rubs, or gallops.  ABDOMEN: Soft, non-tender, non-distended. Bowel sounds present. No organomegaly or mass.  EXTREMITIES: No pedal edema, cyanosis, or clubbing.  NEUROLOGIC: Cranial nerves II through XII are intact. Muscle strength 5/5 in all extremities. Sensation intact. Gait not checked.  PSYCHIATRIC: The patient is alert and oriented x 3.  SKIN: No obvious rash, lesion, or ulcer.  DATA REVIEW:   CBC  Recent Labs Lab 12/22/15 1254  WBC 7.6  HGB 11.9*  HCT 35.2  PLT 255    Chemistries   Recent Labs Lab 12/22/15 1254  NA 137  K 3.7  CL 103  CO2 28  GLUCOSE 111*  BUN 21*  CREATININE 0.70  CALCIUM 9.7  AST 20  ALT 13*  ALKPHOS 35*  BILITOT <0.1*     Microbiology Results  No results found for this or any previous visit.  RADIOLOGY:  Ct Angio Head W/cm &/or Wo Cm  Result Date: 12/23/2015 CLINICAL DATA:  Sudden onset visual disturbance. Headaches for 9 months. EXAM: CT ANGIOGRAPHY HEAD TECHNIQUE: Multidetector CT imaging of the head was performed using the standard protocol during bolus administration of intravenous contrast. Multiplanar CT image reconstructions and MIPs were obtained to evaluate the vascular anatomy. CONTRAST:  75 mL Isovue 370 COMPARISON:  Head CT 12/22/2015 FINDINGS: CT HEAD Brain: Hypoattenuation involving gray and white matter in the left parieto-occipital region has slightly increased in conspicuity. A small amount of associated petechial hemorrhage is similar to the prior CT. There is no evidence of new infarct elsewhere. No mass, midline shift, or extra-axial fluid collection is seen. Ventricles and sulci are within normal limits for age. Calvarium and skull base: No fracture or aggressive osseous lesion. Paranasal sinuses: Visualized paranasal sinuses and mastoid air cells are clear. Orbits: Prior bilateral cataract  extraction. CTA HEAD Anterior circulation: The internal carotid arteries are patent from skullbase to carotid termini. There is mild carotid siphon atherosclerosis bilaterally without significant stenosis. There is a 1.5 mm outpouching projecting laterally from the left ICA in the ophthalmic region. ACAs and MCAs are patent without evidence of major branch occlusion or significant proximal stenosis. Posterior circulation: The visualized distal vertebral arteries are patent to the basilar with the left being dominant. There is mild distal left vertebral artery atherosclerosis without stenosis. PICA is appear patent, although the left PICA origin was not imaged. Dominant left AICA. Patent SCA origins. Patent basilar artery without stenosis. Posterior communicating arteries are not identified. PCAs are patent without evidence of significant proximal stenosis. There is mild asymmetric left PCA and branch vessel irregularity and attenuation. Venous sinuses: Patent. Anatomic variants: None. Delayed phase: Possible minimal enhancement in the region of left parieto-occipital infarct. No masslike enhancement. IMPRESSION: 1. Likely evolving acute/early subacute left parieto-occipital infarct with small amount of petechial hemorrhage. 2. No major intracranial arterial occlusion or significant proximal stenosis. Mild asymmetric attenuation of distal left PCA branch vessels. 3. 1.5 mm left ICA ophthalmic region infundibulum versus aneurysm. Electronically Signed   By: Logan Bores M.D.   On: 12/23/2015 12:22   US Carotid Bilateral (at [facility] Only)  Result Date: 12/23/2015 CLINICAL  DATA:  TIA, stroke, hypertension, smoker EXAM: BILATERAL CAROTID DUPLEX ULTRASOUND TECHNIQUE: Pearline Cables scale imaging, color Doppler and duplex ultrasound were performed of bilateral carotid and vertebral arteries in the neck. COMPARISON:  None. FINDINGS: Criteria: Quantification of carotid stenosis is based on velocity parameters that correlate the  residual internal carotid diameter with NASCET-based stenosis levels, using the diameter of the distal internal carotid lumen as the denominator for stenosis measurement. The following velocity measurements were obtained: RIGHT ICA:  111/26 cm/sec CCA:  [id number] cm/sec SYSTOLIC ICA/CCA RATIO:  [id number] DIASTOLIC ICA/CCA RATIO:  3.0 ECA:  83 cm/sec LEFT ICA:  123/24 cm/sec CCA:  0000000 cm/sec SYSTOLIC ICA/CCA RATIO:  1.3 DIASTOLIC ICA/CCA RATIO:  2.0 ECA:  97 cm/sec RIGHT CAROTID ARTERY: Intimal thickening RIGHT CCA. Minimal tortuosity. Small amount of noncalcified hypoechoic plaque at RIGHT carotid bulb into proximal RIGHT ICA. Laminar flow on color Doppler imaging. Minimal spectral broadening on waveform analysis. No high velocity jets. RIGHT VERTEBRAL ARTERY:  Patent, antegrade LEFT CAROTID ARTERY: Minimal intimal thickening LEFT CCA. Small hyperechoic plaque with minimal shadowing at LEFT carotid bulb. Additional noncalcified hypoechoic plaque at proximal LEFT ICA. Mild tortuosity. Patient a rhythmic during imaging. Laminar flow on color Doppler imaging with spectral broadening on waveform analysis. No high velocity jets. LEFT VERTEBRAL ARTERY:  Patent, antegrade IMPRESSION: Mild plaque formation at the carotid bulbs into proximal internal carotid arteries bilaterally. Velocity measurements correspond to less than 50% diameter stenoses bilaterally. Arrhythmic during imaging. Electronically Signed   By: Lavonia Dana M.D.   On: 12/23/2015 11:05   Dg Chest Port 1 View  Result Date: 12/23/2015 CLINICAL DATA:  Sudden onset blurred vision, headaches EXAM: PORTABLE CHEST 1 VIEW COMPARISON:  11/22/2013 FINDINGS: Chronic interstitial marking/emphysematous changes. Biapical pleural-parenchymal scarring, chronic. No focal consolidation. No pleural effusion or pneumothorax. The heart is normal in size. Median sternotomy. IMPRESSION: No evidence of acute cardiopulmonary disease. Electronically Signed   By: Julian Hy M.D.    On: 12/23/2015 07:12     Management plans discussed with the patient, family and they are in agreement.  CODE STATUS:     Code Status Orders        Start     Ordered   12/23/15 0255  Full code  Continuous     12/23/15 0254    Code Status History    Date Active Date Inactive Code Status Order ID Comments User Context   12/23/2015  2:54 AM 12/23/2015  6:05 AM Full Code [id number]  Harvie Bridge, DO Inpatient    Advance Directive Documentation   Flowsheet Row Most Recent Value  Type of Advance Directive  Healthcare Power of Attorney  Pre-existing out of facility DNR order (yellow form or pink MOST form)  No data  "MOST" Form in Place?  No data      TOTAL TIME TAKING CARE OF THIS PATIENT: 33 minutes.    Demetrios Loll M.D on 12/23/2015 at 1:58 PM  Between 7am to 6pm - Pager - [phone]  After 6pm go to [email or web address] - Proofreader  [facility]  Office  [phone]  CC: Primary care physician; Marguerita Merles, MD   Note: This dictation was prepared with Dragon dictation along with smaller phrase technology. Any transcriptional errors that result from this process are unintentional.

## 2015-12-23 NOTE — Evaluation (Signed)
Physical Therapy Evaluation Patient Details Name: Sheena Holloway MRN: [id number] DOB: July 23, 1940 Today's Date: 12/23/2015   History of Present Illness  presented to ER secondary to acute onset of visual changes (blurry, spots in R lower quadrant); admitted with acute CVA.  CT significant for L parieto-occipital infarct with petecchial hemorrhage; refused MRI secondary to claustrophobia.  Clinical Impression  Upon evaluation, patient alert and oriented; follows all commands and demonstrates good safety awareness/insight.  Bilat UE/LE strength and ROM grossly symmetrical and WFL; no sensory deficit or focal weakness appreciated.  Able to complete bed mobility indep; sit/stand, basic transfers and gait (400') without assist device, distant sup/mod indep.  Slow, but steady, cadence and gait performance without LOB or significant safety concern.  Mild higher-level balance deficits evident via BERG assessment (47/56), esp with activities involving tandem stance or SLS; however, patient with good awareness and use of compensatory strategies noted.  Reports higher-level balance deficits are baseline for her and denies significant change in functional ability with this acute episode. Would benefit from skilled PT to address above deficits and promote optimal return to PLOF; will maintain on caseload during acute hospitalization to promote continued mobiltiy and address higher-level balance deficits.  Anticipate no skilled PT needs upon discharge.      Follow Up Recommendations No PT follow up (will maintain on caseload for acute hospitalization; anticipate not PT needs upon discharge)    Equipment Recommendations       Recommendations for Other Services       Precautions / Restrictions Precautions Precautions: Fall Restrictions Weight Bearing Restrictions: No      Mobility  Bed Mobility Overal bed mobility: Independent                Transfers Overall transfer level: Modified  independent                  Ambulation/Gait Ambulation/Gait assistance: Supervision;Modified independent (Device/Increase time) Ambulation Distance (Feet): 400 Feet Assistive device: None       General Gait Details: reciprocal stepping pattern with fair step height/length, slow but steady cadence without gross LOB or safety concern.  Stairs            Wheelchair Mobility    Modified Rankin (Stroke Patients Only)       Balance Overall balance assessment: Needs assistance Sitting-balance support: No upper extremity supported;Feet supported Sitting balance-Leahy Scale: Good     Standing balance support: No upper extremity supported Standing balance-Leahy Scale: Good                   Standardized Balance Assessment Standardized Balance Assessment : Berg Balance Test Berg Balance Test Sit to Stand: Able to stand without using hands and stabilize independently Standing Unsupported: Able to stand safely 2 minutes Sitting with Back Unsupported but Feet Supported on Floor or Stool: Able to sit safely and securely 2 minutes Stand to Sit: Sits safely with minimal use of hands Transfers: Able to transfer safely, minor use of hands Standing Unsupported with Eyes Closed: Able to stand 10 seconds safely Standing Ubsupported with Feet Together: Able to place feet together independently and stand 1 minute safely From Standing, Reach Forward with Outstretched Arm: Can reach confidently >25 cm (10") From Standing Position, Pick up Object from Floor: Able to pick up shoe safely and easily From Standing Position, Turn to Look Behind Over each Shoulder: Looks behind from both sides and weight shifts well Turn 360 Degrees: Able to turn 360 degrees safely one side  only in 4 seconds or less Standing Unsupported, Alternately Place Feet on Step/Stool: Able to complete >2 steps/needs minimal assist Standing Unsupported, One Foot in Front: Needs help to step but can hold 15  seconds Standing on One Leg: Able to lift leg independently and hold equal to or more than 3 seconds Total Score: 47         Pertinent Vitals/Pain Pain Assessment: No/denies pain Pain Score: 3  Pain Location: headache Pain Descriptors / Indicators: Aching Pain Intervention(s): Limited activity within patient's tolerance    Home Living Family/patient expects to be discharged to:: Private residence Living Arrangements: Spouse/significant other Available Help at Discharge: Family Type of Home: House Home Access: Level entry Entrance Stairs-Rails: Right Entrance Stairs-Number of Steps: Unable to recall Home Layout: Two level;Bed/bath upstairs Home Equipment: None      Prior Function Level of Independence: Independent         Comments: Indep with ADLs, household and community mobility     Hand Dominance   Dominant Hand: Right    Extremity/Trunk Assessment   Upper Extremity Assessment: Overall WFL for tasks assessed           Lower Extremity Assessment: Overall WFL for tasks assessed (grossly 4+/5 throughout, no sensory deficit or focal weakness appreciated)         Communication   Communication: No difficulties  Cognition Arousal/Alertness: Awake/alert Behavior During Therapy: WFL for tasks assessed/performed Overall Cognitive Status: Within Functional Limits for tasks assessed                      General Comments      Exercises Other Exercises Other Exercises: BERG balance assessment 47/56--indicative of higher-level balance deficits, esp with tasks involving periods of narrowed/tandem, SLS; good awareness and use of compensatory strategies as needed      Assessment/Plan    PT Assessment Patient needs continued PT services  PT Diagnosis Difficulty walking;Generalized weakness   PT Problem List Decreased balance  PT Treatment Interventions DME instruction;Gait training;Stair training;Functional mobility training;Therapeutic  activities;Therapeutic exercise;Balance training;Patient/family education   PT Goals (Current goals can be found in the Care Plan section) Acute Rehab PT Goals Patient Stated Goal: to return home PT Goal Formulation: With patient Time For Goal Achievement: 01/06/16 Potential to Achieve Goals: Good Additional Goals Additional Goal #1: Improve BERG 5-7 points for improved safety/indep with functional activities    Frequency Min 2X/week   Barriers to discharge Decreased caregiver support      Co-evaluation               End of Session Equipment Utilized During Treatment: Gait belt Activity Tolerance: Patient tolerated treatment well Patient left: in bed;with call bell/phone within reach;with bed alarm set Nurse Communication: Mobility status    Functional Assessment Tool Used: clinical judgement, BERG Functional Limitation: Mobility: Walking and moving around Mobility: Walking and Moving Around Current Status [id number]): At least 1 percent but less than 20 percent impaired, limited or restricted Mobility: Walking and Moving Around Goal Status [phone]): 0 percent impaired, limited or restricted    Time: 1355-1419 PT Time Calculation (min) (ACUTE ONLY): 24 min   Charges:   PT Evaluation $PT Eval Low Complexity: 1 Procedure PT Treatments $Neuromuscular Re-education: 8-22 mins   PT G Codes:   PT G-Codes **NOT FOR INPATIENT CLASS** Functional Assessment Tool Used: clinical judgement, BERG Functional Limitation: Mobility: Walking and moving around Mobility: Walking and Moving Around Current Status [id number]): At least 1 percent but less than 20  percent impaired, limited or restricted Mobility: Walking and Moving Around Goal Status [phone]): 0 percent impaired, limited or restricted    Rodger Giangregorio H. Owens Shark, PT, DPT, NCS 12/23/15, 3:09 PM [phone]

## 2015-12-23 NOTE — Progress Notes (Signed)
Speech Therapy Note: received order, reviewed chart notes. Consulted NSG then pt. Pt denied any difficulty swallowing and is currently on a regular diet; tolerates swallowing pills w/ water per NSG. Pt conversed at conversational level w/out deficits noted; pt denied any speech-language deficits. No further skilled ST services indicated as pt appears at her baseline. Pt agreed, she had no questions/concerns regarding her speech-language or swallowing. NSG to reconsult if any change in status.

## 2015-12-23 NOTE — Discharge Instructions (Signed)
Heart healthy diet. °Activity as tolerated. °

## 2015-12-23 NOTE — Plan of Care (Signed)
Problem: Pain Managment: Goal: General experience of comfort will improve Outcome: Not Progressing Pt with severe headache prior to admission, still complaining of headache. Percocet given one tab 5/325. Headache improved but worsens with movement per patient.   Problem: Bowel/Gastric: Goal: Will not experience complications related to bowel motility Outcome: Progressing Pt said she had a bm last Wednesday, stated that it was not uncommon for her to not have a bm but once every week or two weeks.   Problem: Tissue Perfusion: Goal: Complications of Intracerebral Hemorrhage will be minimized (choose ONE based on patient diagnosis) Outcome: Not Progressing Pt still with some complaints of seeing silver spots/ floaters. Pt said it was not affecting her vision. Pt also said that her left eye had some visual defects but this was not nothing new. NIH 0.

## 2015-12-23 NOTE — ED Notes (Signed)
Let CN know that pt's bed approval has been pending.

## 2015-12-23 NOTE — Care Management (Signed)
Occupational therapy recommending home health at this time. Discussed home health (Occupational therapy) in the home. Declined these services at this time. Discharge to home today per Dr. Haydee Monica RN MSN CCM Care Management [phone]

## 2015-12-23 NOTE — Progress Notes (Signed)
Pt given d/c instructions r/t activity, diet, when to call MD, followup care, and medications voiced understanding, pt given prescriptions x 1, pt d/c home via wheelchair escorted by staff and family

## 2015-12-24 LAB — ECHOCARDIOGRAM COMPLETE
Height: 66 in
Weight: 1768 oz

## 2016-04-14 ENCOUNTER — Encounter: Payer: Self-pay | Admitting: *Deleted

## 2016-04-14 ENCOUNTER — Emergency Department
Admission: EM | Admit: 2016-04-14 | Discharge: 2016-04-14 | Discharge status: Against Medical Advice | Payer: Medicare Other | Attending: Emergency Medicine | Admitting: Emergency Medicine

## 2016-04-14 DIAGNOSIS — F17210 Nicotine dependence, cigarettes, uncomplicated: Secondary | ICD-10-CM | POA: Diagnosis not present

## 2016-04-14 DIAGNOSIS — I10 Essential (primary) hypertension: Secondary | ICD-10-CM | POA: Diagnosis not present

## 2016-04-14 DIAGNOSIS — R4182 Altered mental status, unspecified: Secondary | ICD-10-CM | POA: Diagnosis present

## 2016-04-14 DIAGNOSIS — Z7982 Long term (current) use of aspirin: Secondary | ICD-10-CM | POA: Diagnosis not present

## 2016-04-14 DIAGNOSIS — Z79899 Other long term (current) drug therapy: Secondary | ICD-10-CM | POA: Insufficient documentation

## 2016-04-14 DIAGNOSIS — Z5321 Procedure and treatment not carried out due to patient leaving prior to being seen by health care provider: Secondary | ICD-10-CM | POA: Diagnosis not present

## 2016-04-14 LAB — COMPREHENSIVE METABOLIC PANEL
ALBUMIN: 4.3 g/dL (ref 3.5–5.0)
ALT: 15 U/L (ref 14–54)
AST: 28 U/L (ref 15–41)
Alkaline Phosphatase: 35 U/L — ABNORMAL LOW (ref 38–126)
Anion gap: 6 (ref 5–15)
BUN: 25 mg/dL — AB (ref 6–20)
CHLORIDE: 107 mmol/L (ref 101–111)
CO2: 29 mmol/L (ref 22–32)
CREATININE: 0.68 mg/dL (ref 0.44–1.00)
Calcium: 10.3 mg/dL (ref 8.9–10.3)
GFR calc Af Amer: >60 mL/min (ref >60)
GFR calc non Af Amer: >60 mL/min (ref >60)
GLUCOSE: 98 mg/dL (ref 65–99)
Potassium: 3.7 mmol/L (ref 3.5–5.1)
SODIUM: 142 mmol/L (ref 135–145)
Total Bilirubin: 0.8 mg/dL (ref 0.3–1.2)
Total Protein: 8.1 g/dL (ref 6.5–8.1)

## 2016-04-14 LAB — CBC
HCT: 39.2 % (ref 35.0–47.0)
Hemoglobin: 12.8 g/dL (ref 12.0–16.0)
MCH: 29.2 pg (ref 26.0–34.0)
MCHC: 32.8 g/dL (ref 32.0–36.0)
MCV: 88.9 fL (ref 80.0–100.0)
Platelets: 260 10*3/uL (ref 150–440)
RBC: 4.41 MIL/uL (ref 3.80–5.20)
RDW: 15.7 % — AB (ref 11.5–14.5)
WBC: 8.7 10*3/uL (ref 3.6–11.0)

## 2016-04-14 NOTE — ED Triage Notes (Signed)
Pt states "I am loosing it", states when she is walking around her house she is unsure how to get back to certain rooms, denies any pain, states occasional dizziness, states symptoms for 1 week, awake and alert. Husband states she has been more confused then normal, states she has been doing things at home that she seems confused

## 2016-04-14 NOTE — ED Notes (Signed)
Pt arguing with husband about being here, pt insists that she doesn't want to be seen, husband wants pt to be seen. Pt left without husband to go outside, officer talked with pt in parking lot and pt refuses to come back into er to be seen. Advised to follow up with PCP.

## 2016-06-14 ENCOUNTER — Emergency Department
Admission: EM | Admit: 2016-06-14 | Discharge: 2016-06-14 | Disposition: A | Discharge status: Home / Self Care | Attending: Emergency Medicine | Admitting: Emergency Medicine

## 2016-06-14 ENCOUNTER — Emergency Department

## 2016-06-14 DIAGNOSIS — I10 Essential (primary) hypertension: Secondary | ICD-10-CM | POA: Insufficient documentation

## 2016-06-14 DIAGNOSIS — R4182 Altered mental status, unspecified: Secondary | ICD-10-CM | POA: Diagnosis not present

## 2016-06-14 DIAGNOSIS — Z85841 Personal history of malignant neoplasm of brain: Secondary | ICD-10-CM | POA: Diagnosis not present

## 2016-06-14 DIAGNOSIS — Z7982 Long term (current) use of aspirin: Secondary | ICD-10-CM | POA: Insufficient documentation

## 2016-06-14 DIAGNOSIS — Z79899 Other long term (current) drug therapy: Secondary | ICD-10-CM | POA: Insufficient documentation

## 2016-06-14 DIAGNOSIS — R569 Unspecified convulsions: Secondary | ICD-10-CM | POA: Insufficient documentation

## 2016-06-14 DIAGNOSIS — F17210 Nicotine dependence, cigarettes, uncomplicated: Secondary | ICD-10-CM | POA: Insufficient documentation

## 2016-06-14 HISTORY — DX: Malignant neoplasm of brain, unspecified: C71.9

## 2016-06-14 LAB — CBC
HEMATOCRIT: 33.6 % — AB (ref 35.0–47.0)
Hemoglobin: 11.4 g/dL — ABNORMAL LOW (ref 12.0–16.0)
MCH: 29.9 pg (ref 26.0–34.0)
MCHC: 34 g/dL (ref 32.0–36.0)
MCV: 88 fL (ref 80.0–100.0)
Platelets: 302 10*3/uL (ref 150–440)
RBC: 3.82 MIL/uL (ref 3.80–5.20)
RDW: 14.8 % — AB (ref 11.5–14.5)
WBC: 9.6 10*3/uL (ref 3.6–11.0)

## 2016-06-14 LAB — BASIC METABOLIC PANEL
ANION GAP: 11 (ref 5–15)
BUN: 20 mg/dL (ref 6–20)
CO2: 30 mmol/L (ref 22–32)
Calcium: 9.4 mg/dL (ref 8.9–10.3)
Chloride: 100 mmol/L — ABNORMAL LOW (ref 101–111)
Creatinine, Ser: 0.46 mg/dL (ref 0.44–1.00)
Glucose, Bld: 125 mg/dL — ABNORMAL HIGH (ref 65–99)
Potassium: 3.3 mmol/L — ABNORMAL LOW (ref 3.5–5.1)
SODIUM: 141 mmol/L (ref 135–145)

## 2016-06-14 MED ORDER — LEVETIRACETAM 500 MG PO TABS: 500.0000 mg | ORAL_TABLET | Freq: Two times a day (BID) | ORAL | 0 refills | Status: AC

## 2016-06-14 MED ORDER — SODIUM CHLORIDE 0.9 % IV SOLN
1000.0000 mg | Freq: Once | INTRAVENOUS | Status: AC
  Administered 2016-06-14: 1000 mg via INTRAVENOUS
  Filled 2016-06-14: qty 10

## 2016-06-14 MED ORDER — LEVETIRACETAM 500 MG PO TABS: 500.0000 mg | ORAL_TABLET | Freq: Two times a day (BID) | ORAL | 1 refills | Status: AC

## 2016-06-14 NOTE — ED Triage Notes (Addendum)
Pt presents via EMS with c/o seizure x3. Pt has hx brain tumor and is currently under hospice care. DNR in place. Altered mental status currently.

## 2016-06-14 NOTE — ED Provider Notes (Signed)
Patient is immobile, unable to stand or walk. Concern for rapidly declining prognosis based on eval today   Lavonia Drafts, MD 06/14/16 [phone]

## 2016-06-14 NOTE — ED Notes (Signed)
Pt with dry underwear, no evidence of urine or stool noted. Cardiac monitor leads removed. Pt opens eyes during process. Pt updated ems is here to transport her to hospice house.

## 2016-06-14 NOTE — Clinical Social Work Note (Signed)
Clinical Social Work Assessment  Patient Details  Name: Sheena Holloway MRN: [id number] Date of Birth: [birth year]  Date of referral:  06/14/16               Reason for consult:  End of Life/Hospice                Permission sought to share information with:  Family Supports, Customer service manager Permission granted to share information::  Yes, Verbal Permission Granted  Name::     Sheena Holloway and Sheena Holloway [phone] ( wife) [phone]  Agency::  [facility]   Relationship::     Contact Information:     Housing/Transportation Living arrangements for the past 2 months:  Single Family Home Source of Information:  Adult Children Patient Interpreter Needed:  None Criminal Activity/Legal Involvement Pertinent to Current Situation/Hospitalization:  No - Comment as needed Significant Relationships:  Adult Children, Spouse Lives with:  Siblings, Relatives Do you feel safe going back to the place where you live?  No Need for family participation in patient care:  Yes (Comment)  Care giving concerns:  There are some care giving concerns with family dynamics   Social Worker assessment / plan:  LCSWLCSW introduced myself to entire family and relayed that I was here to assess the family and patient needs.It confirmed with family there is no HCPOA but there is a DNR and that they want her to go directly to the Hospice house.  According to son the patient was up and walking yesterday and after 5  seizures she is not able to walk , talk and is declining rapidly. This patient has a large family. She is currently living with her sister Sheena Holloway who is not able to take care of the patient. Her husband is at [facility] and has Pancreatic cancer. She has 2 sons Sheena Holloway and Sheena Holloway. Patient has left her eldest sons home Sheena Holloway) in the last 5 days and begged her younger sonMilta Holloway) to take her to her sisters house. The family did contact Sheena Holloway and spoke about the family concerns last week [employer]) and they had the  Patients spouse brought to ER and then to [facility].   LCSW called and spoke to [facility] Coordinator of [place] and will provide/fax doctors reports, prescription, MAR and Labs. In consultation with Dr Lord/Dr Corky Downs they report that this patient is close to end of life and would benefit from [facility].   Employment status:  Retired Forensic scientist:  Medicaid In [employer] ([employer]HC/Medicare) PT Recommendations:  Not assessed at this time Information / Referral to community resources:   None  Patient/Family's Response to care:  Would like patient to go to [facility]  Patient/Family's Understanding of and Emotional Response to Diagnosis, Current Treatment, and Prognosis:  Good Understanding  Emotional Assessment Appearance:  Appears older than stated age Attitude/Demeanor/Rapport:  Unable to Assess Affect (typically observed):  Unable to Assess Orientation:  Fluctuating Orientation (Suspected and/or reported Sundowners) Alcohol / Substance use:  Not Applicable Psych involvement (Current and /or in the community):  No (Comment)  Discharge Needs  Concerns to be addressed:  Care Coordination Readmission within the last 30 days:  No Current discharge risk:  None Barriers to Discharge:  No Barriers Identified   Joana Reamer, LCSW 06/14/2016, 4:06 PM

## 2016-06-14 NOTE — Progress Notes (Signed)
LCSW introduced myself to entire family and relayed that I was here to assess the family and patient needs.It confirmed with family there is no HCPOA but there is a DNR and that they want her to go directly to the Hospice house.  According to son the patient was up and walking yesterday and after 5  seizures she is not able to walk , talk and is declining rapidly.   LCSW called and spoke to [facility] Coordinator of [place] and will provide/fax doctors reports, prescription, MAR and Labs. In consultation with Dr Lord/Dr Corky Downs they report that this patient is close to end of life and would benefit from [facility].  LCSW will complete assessment and call Debbie/Hospice [phone] and fax over information.   [employer] LCSW [phone]

## 2016-06-14 NOTE — ED Notes (Signed)
Report to debbie, rn at hospice home.

## 2016-06-14 NOTE — ED Notes (Signed)
Pt resting with eyes closed, family at bedside. Family updated on transport to hospice home. Family verbalizes understanding. Pt briefly opens eyes when rn speaks to her, but is non verbal. Vital signs stable.

## 2016-06-14 NOTE — Discharge Instructions (Addendum)
You're being started on Keppra for seizures associated with underlying brain tumor.  Sheena Holloway presented to [facility] with multiple seizures today. Briefly she was admitted to [facility] approximately 4 weeks ago for confusion, headaches, and visual disturbance. 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 She underwent craniotomy for resection on 12/29. Reportedly Her condition has been worsening and today she had multiple seizures. Her family has asked for hospice home care.

## 2016-06-14 NOTE — ED Notes (Signed)
Report from hunter, rn.  

## 2016-06-14 NOTE — Progress Notes (Signed)
LCSW confirmed with [place] that fax and documentation was received. Debbie reported she is going to make the arrangements to have patient picked up by [facility] and has accepted patient a hospice bed.  LCSW provided family contact information to [place] and she will be calling them shortly.  Consulted with EDP Dr Corky Downs  And explained [facility] will be taking the patient. He agreed to complete discharge documents.   Met with several  Family members and provided support and explained that patient has been accepted and [place] Coordinator is making transportation arrangements and will has their Mother picked up ( in a few hours).  LCSW let ED secretary know as well.  No further needs at this time   [employer] LCSW [phone]

## 2016-06-14 NOTE — ED Provider Notes (Signed)
Patient accepted to hospice home, SW is arranging transfer   Sheena Drafts, MD 06/14/16 [phone]

## 2016-06-14 NOTE — ED Notes (Signed)
Pending admission to inpatient hospice, pending transport per SW.

## 2016-06-14 NOTE — ED Provider Notes (Signed)
[facility] Emergency Department Provider Note ____________________________________________   I have reviewed the triage vital signs and the triage nursing note.  HISTORY  Chief Complaint Seizures   Historian Limited history as patient has altered mental status, and son was not there but did take a history from his aunt  HPI Sheena Holloway is a 76 y.o. female who is currently living with her sister, who has some history caregiver training and described this patient would've had 3 short seizures, although the son who is giving me the history was unable to provide details in terms of altered mental status, stiffness, shaking, etc. In any case, she seems to be less awake than she had previously been according to the son.  She had recently been diagnosed with glioblastoma and had surgery at [facility] about a month ago, per the son. She declined any adjunctive cancer treatments and is under hospice care and is a DO NOT RESUSCITATE. She is currently living with her sister which is down the road from the son who is providing history today.  Review of medications shows no specific antiepileptic, however Ativan is one of her pills.    Past Medical History:  Diagnosis Date  . Anxiety   . Brain cancer ([place])   . Hypertension     Patient Active Problem List   Diagnosis Date Noted  . CVA (cerebral infarction) 12/23/2015    Past Surgical History:  Procedure Laterality Date  . ABDOMINAL HYSTERECTOMY      Prior to Admission medications   Medication Sig Start Date End Date Taking? Authorizing Provider  aspirin EC 81 MG EC tablet Take 1 tablet (81 mg total) by mouth daily. 12/23/15   Demetrios Loll, MD  atenolol (TENORMIN) 50 MG tablet Take 50 mg by mouth daily. 04/09/16   Historical Provider, MD  atorvastatin (LIPITOR) 40 MG tablet Take 1 tablet (40 mg total) by mouth daily at 6 PM. 12/23/15   Demetrios Loll, MD  famotidine (PEPCID) 20 MG tablet Take 20 mg by mouth 2 (two) times  daily. 06/10/16   Historical Provider, MD  hydrALAZINE (APRESOLINE) 25 MG tablet Take 25 mg by mouth 2 (two) times daily. 06/04/16   Historical Provider, MD  levETIRAcetam (KEPPRA) 500 MG tablet Take 1 tablet (500 mg total) by mouth 2 (two) times daily. 06/14/16   Lisa Roca, MD  lisinopril (PRINIVIL,ZESTRIL) 20 MG tablet Take 20 mg by mouth daily. 06/10/16   Historical Provider, MD  LORazepam (ATIVAN) 1 MG tablet Take 1 mg by mouth as needed for seizure. 06/01/16   Historical Provider, MD  metoprolol (LOPRESSOR) 50 MG tablet Take 50 mg by mouth 2 (two) times daily.    Historical Provider, MD  morphine (MS CONTIN) 15 MG 12 hr tablet Take 15 mg by mouth every 12 (twelve) hours. 06/01/16   Historical Provider, MD  SENNA PLUS 8.6-50 MG tablet TABLET TWO TO FOUR TABLETS BY MOUTH EVERY DAY 06/10/16   Historical Provider, MD    No Known Allergies  History reviewed. No pertinent family history.  Social History Social History  Substance Use Topics  . Smoking status: Current Every Day Smoker    Packs/day: 0.50    Types: Cigarettes  . Smokeless tobacco: Never Used  . Alcohol use No    Review of Systems Unable to obtain due to patient's altered mental status. There is no report by the son's history of recent illness or fevers or coughing or chest pain or vomiting or diarrhea ____________________________________________  PHYSICAL EXAM:  VITAL SIGNS: ED Triage Vitals  Enc Vitals Group     BP 06/14/16 1145 (!) 165/81     Pulse Rate 06/14/16 1145 72     Resp 06/14/16 1145 16     Temp --      Temp src --      SpO2 06/14/16 1145 98 %     Weight 06/14/16 1146 110 lb (49.9 kg)     Height 06/14/16 1146 5\' 9"  (1.753 m)     Head Circumference --      [place] --      Pain Score --      Pain Loc --      Pain Edu? --      Excl. in [place]? --      Constitutional: Has her eyes closed but tries to open them to voice. No obvious facial droop. No respiratory distress. HEENT   Head: Head shaved.       Eyes: Conjunctivae are normal. PERRL. Normal extraocular movements.      Ears:         Nose: No congestion/rhinnorhea.   Mouth/Throat: Mucous membranes are moist.   Neck: No stridor. Cardiovascular/Chest: Normal rate, regular rhythm.  No murmurs, rubs, or gallops. Respiratory: Normal respiratory effort without tachypnea nor retractions. Breath sounds are clear and equal bilaterally. No wheezes/rales/rhonchi. Gastrointestinal: Soft. No distention, no guarding, no rebound. Nontender.  Thin  Genitourinary/rectal:Deferred Musculoskeletal: No extremity deformities. No lower extremity edema. Neurologic: No obvious facial droop. Patient is not speaking. Tries to open eyes to voice, not following commands.  Moves 4 extremities. Unable to offer with the rest of the focal exam. Skin:  Skin is warm, dry and intact. No rash noted. Psychiatric: No agitation.   ____________________________________________  LABS (pertinent positives/negatives)  Labs Reviewed  BASIC METABOLIC PANEL - Abnormal; Notable for the following:       Result Value   Potassium 3.3 (*)    Chloride 100 (*)    Glucose, Bld 125 (*)    All other components within normal limits  CBC - Abnormal; Notable for the following:    Hemoglobin 11.4 (*)    HCT 33.6 (*)    RDW 14.8 (*)    All other components within normal limits  URINALYSIS, COMPLETE (UACMP) WITH MICROSCOPIC    ____________________________________________    EKG I, Lisa Roca, MD, the attending physician have personally viewed and interpreted all ECGs.  71 bpm. Normal sinus rhythm. Narrow QRS. Normal axis. Nonspecific T-wave with minimal ST segment depression in II, III, and F aVF and T wave inversion laterally. When compared with prior EKG, T-wave inversions laterally were present but not quite as deep as they are today. ____________________________________________  RADIOLOGY All Xrays were viewed by me. Imaging interpreted by Radiologist.  CT head  without contrast:  IMPRESSION: Postop left parietal craniotomy for tumor resection. There is progression of tumor around the surgical cavity compared with the prior CT of 12/23/2015. This would be better evaluated by MRI  No acute infarct or hemorrhage. __________________________________________  PROCEDURES  Procedure(s) performed: None  Critical Care performed: None  ____________________________________________   ED COURSE / ASSESSMENT AND PLAN  Pertinent labs & imaging results that were available during my care of the patient were reviewed by me and considered in my medical decision making (see chart for details).   Ms. Spagnolo was brought in by her son after apparent witnessed 3 seizures this morning. She is not back to her baseline mental status.  Unfortunately she has a history of recent glioblastoma surgical resection and is under hospice care after declining adjunctive oncology treatments.   Laboratory studies are overall reassuring. No additional concern for infectious process.  I spoke with on-call neurologist, Dr. Doy Mince who recommended 1000 mg Keppra load, followed by 500 mg twice daily.  Other family members showed up who have spoken with the hospitalist nurse today, and are interested in sending the patient to the hospice home. I did consult the social worker, clonidine, help arrange this.  Patient care transferred to Dr. Ermalinda Barrios at shift change 3 PM. Awaiting social work consultation and disposition of fluid to the hospice home.  CONSULTATIONS: Neurology, Dr. Doy Mince by phone. Clonidine, Education officer, museum.   Patient / Family / Caregiver informed of clinical course, medical decision-making process, and agree with plan.   I discussed return precautions, follow-up instructions, and discharge instructions with patient and/or family.   ___________________________________________   FINAL CLINICAL IMPRESSION(S) / ED DIAGNOSES   Final diagnoses:  Seizure [facility])               Note: This dictation was prepared with Dragon dictation. Any transcriptional errors that result from this process are unintentional    Lisa Roca, MD 06/14/16 1459

## 2016-06-14 NOTE — Clinical Social Work Note (Signed)
CSW met with ED CSW to assist with hospice facility contact information. CSW contacted Admissions Coordinator [facility], and left a voicemail asking that she contact the ED CSW about a possible hospice facility referral. CSW will con't to follow pending admission to inpatient or any additional needs.  Santiago Bumpers, MSW, Latanya Presser [phone]

## 2016-08-18 DEATH — deceased

## 2017-10-31 IMAGING — CT CT HEAD W/O CM
3 series · 15 of 44 positions shown, 18 images · non-contrast
Comparison: 01/22/2011 CT

CLINICAL DATA: 75-year-old female with headache and confusion for 2
days.

EXAM:
CT HEAD WITHOUT CONTRAST
TECHNIQUE: Contiguous axial images were obtained from the base of the skull
through the vertex without intravenous contrast.

[Series 2: head wo · axial · 0.47mm/px · z∈[-145,-35]mm · 9 of 27 slices shown, 12 images]
[im 3/27  brain]
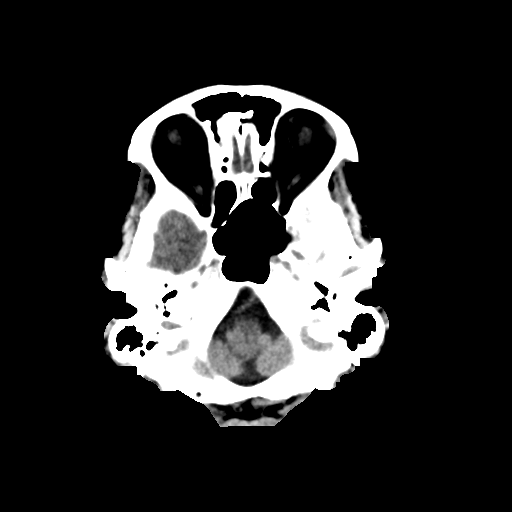
[im 3/27  bone]
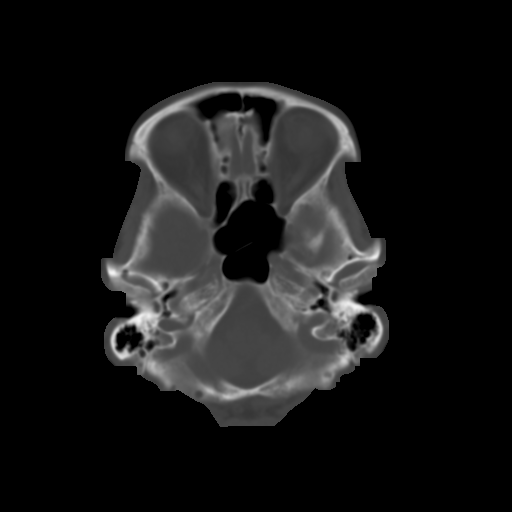
[im 6/27  brain]
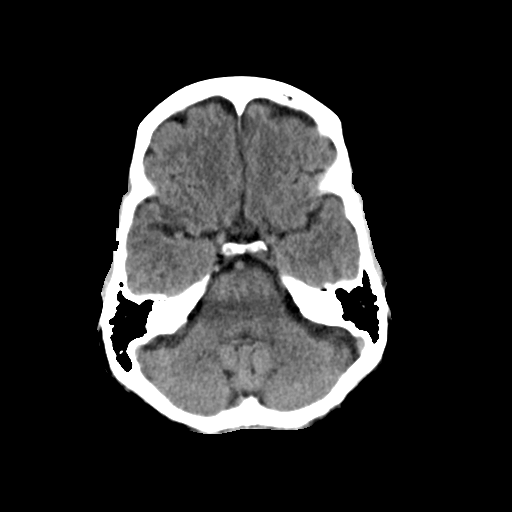
[im 8/27  brain]
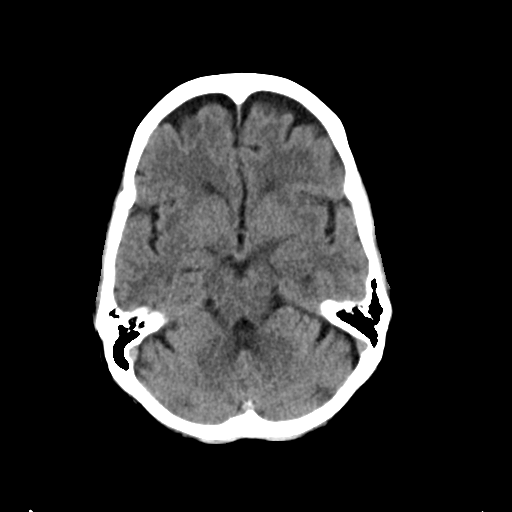
[im 11/27  brain]
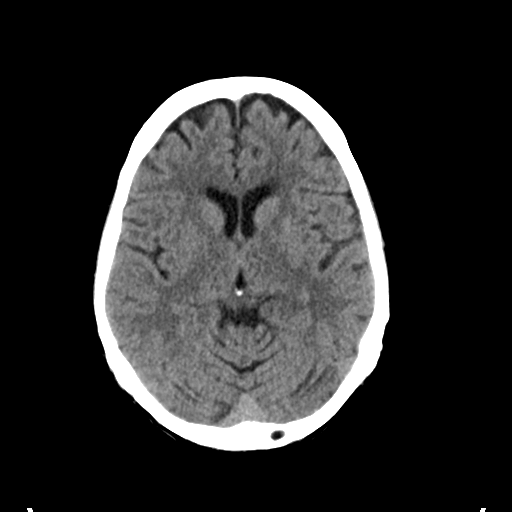
[im 14/27  brain]
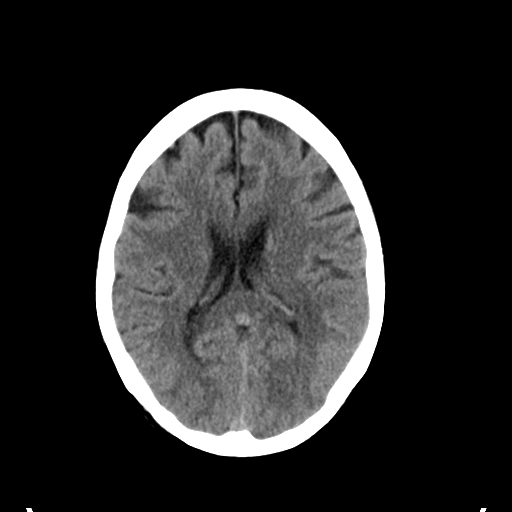
[im 14/27  bone]
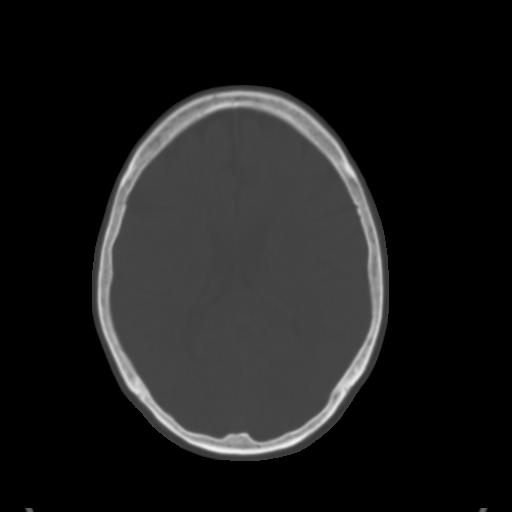
[im 17/27  brain]
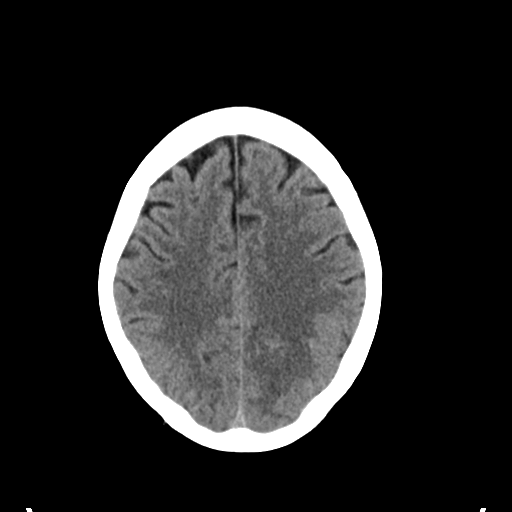
[im 20/27  brain]
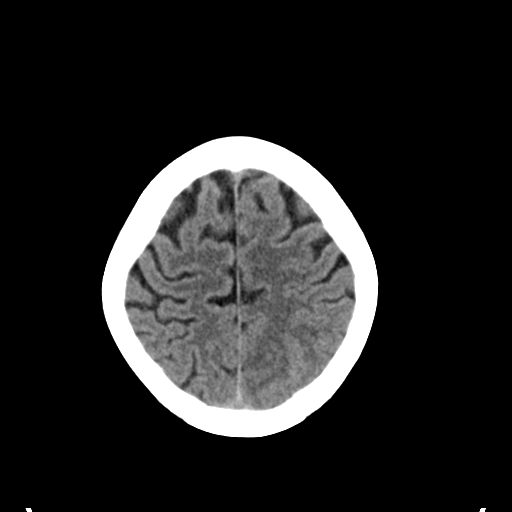
[im 22/27  brain]
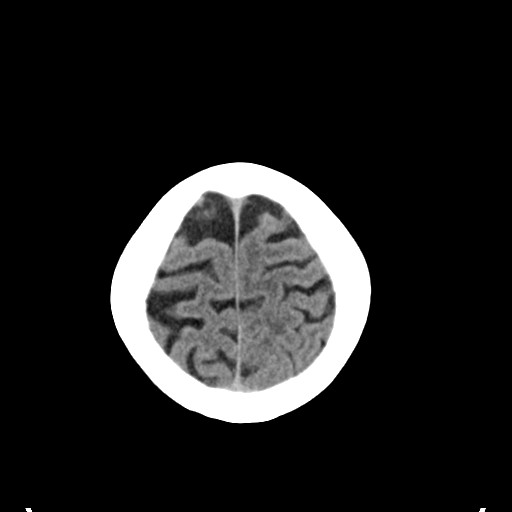
[im 25/27  brain]
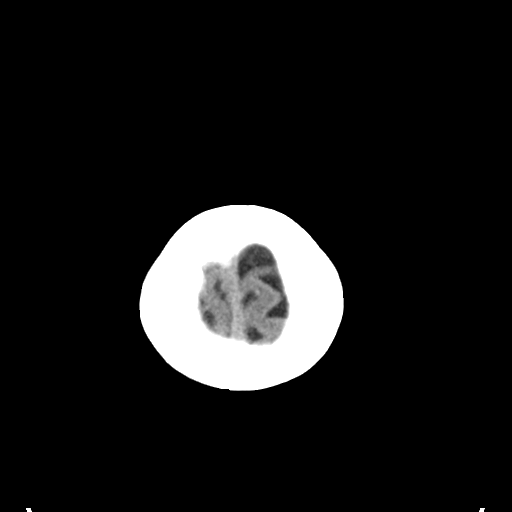
[im 25/27  bone]
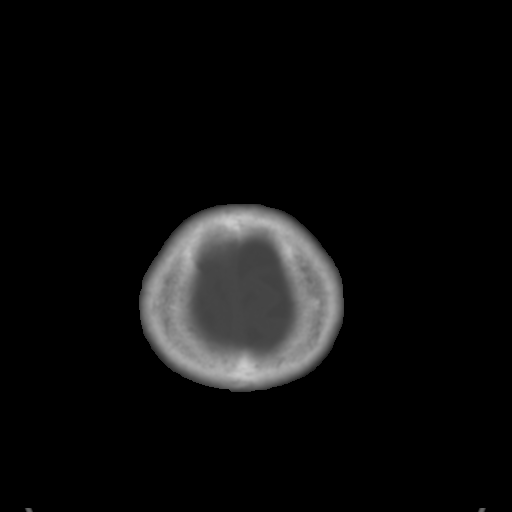

[Series 4: coronal soft tissue · coronal · 0.25mm/px · 3 of 59 slices shown]
[im 20/59  brain]
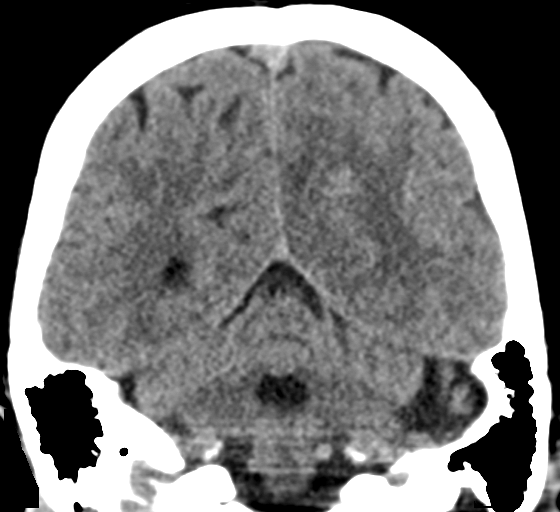
[im 26/59  brain]
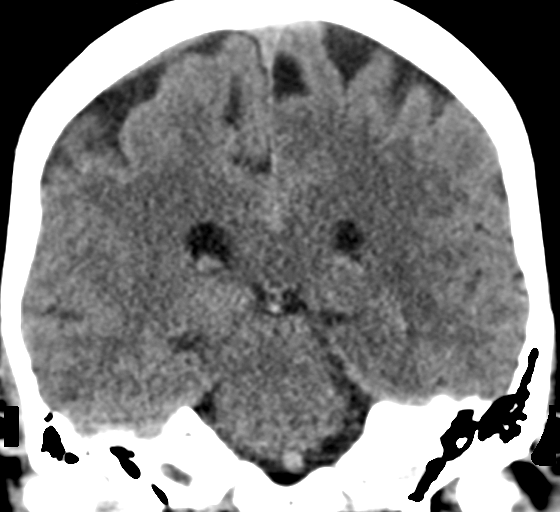
[im 33/59  brain]
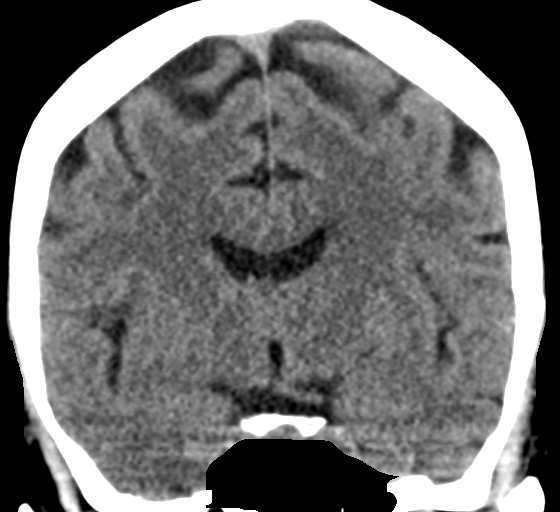

[Series 5: sagittal soft tissue · sagittal · 0.27mm/px · 3 of 47 slices shown]
[im 16/47  brain]
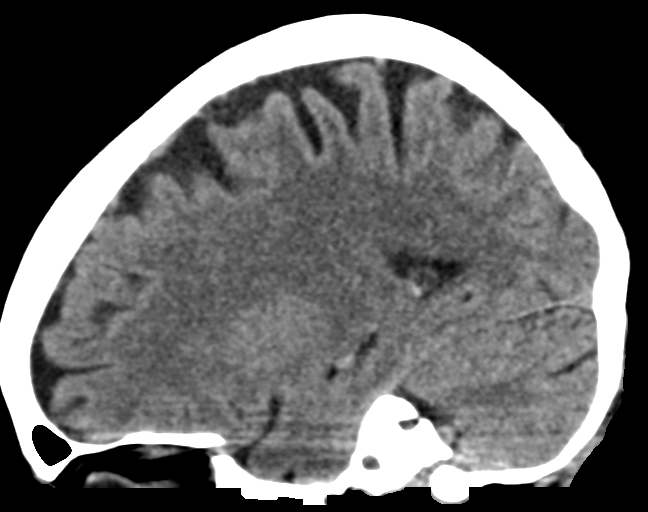
[im 24/47  brain]
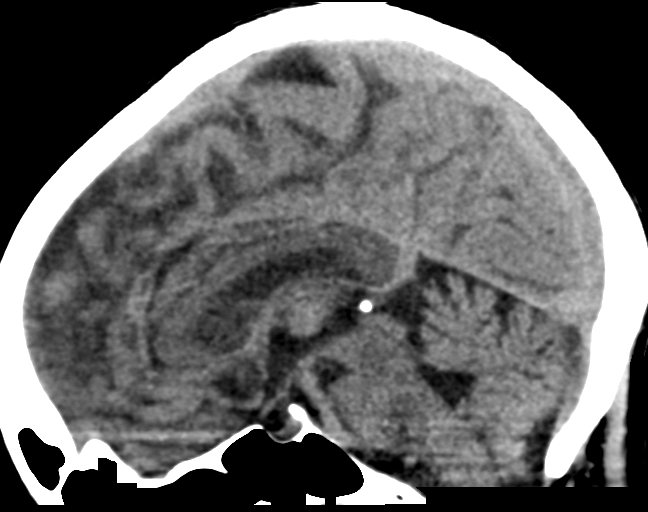
[im 31/47  brain]
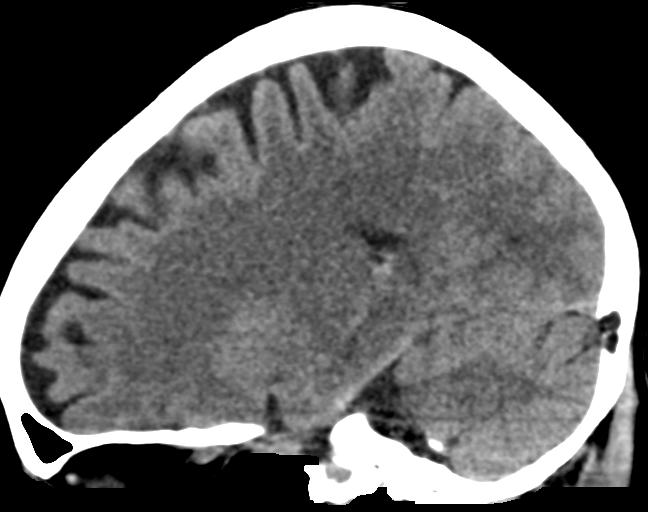

[15 of 44 positions shown; findings below may reference images not displayed]

FINDINGS: Brain: Apparent loss of gray-white differentiation within the medial
left occipitoparietal region noted with associated slightly
hyperdense 6 mm structure (image 18). MRI recommended for further
evaluation.

There is no evidence of midline shift, hydrocephalus or extra-axial
collection.

Vascular: No hyperdense vessel noted. Mild intracranial
atherosclerosis noted.

Skull: Unremarkable

Sinuses/Orbits: Visualized portions unremarkable

Other: None
IMPRESSION: Apparent loss of gray-white differentiation in the medial left
occipitoparietal region with slightly hyperdense structure. This may
represent an infarct with petechial hemorrhage but MRI is
recommended for further evaluation.

## 2017-11-01 IMAGING — DX DG CHEST 1V PORT
1 series · 1 of 1 positions shown · non-contrast
Comparison: 11/22/2013

CLINICAL DATA: Sudden onset blurred vision, headaches

EXAM:
PORTABLE CHEST 1 VIEW

[chest ap]
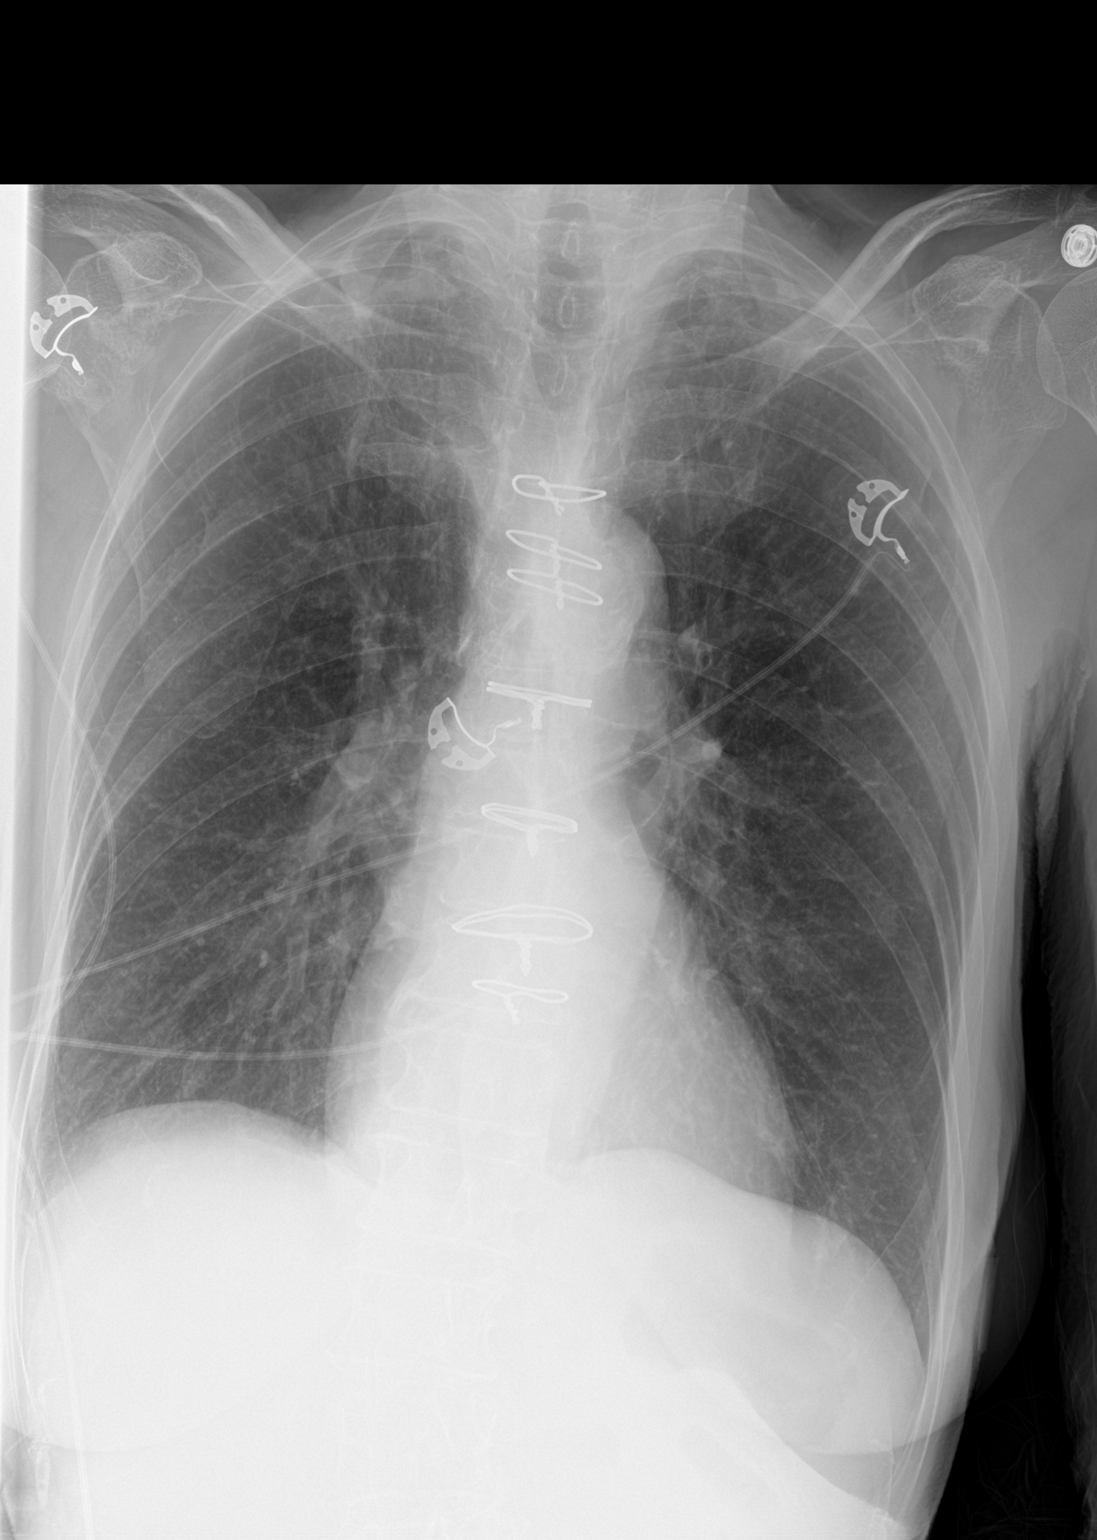

[1 of 1 positions shown; findings below may reference images not displayed]

FINDINGS: Chronic interstitial marking/emphysematous changes. Biapical
pleural-parenchymal scarring, chronic. No focal consolidation. No
pleural effusion or pneumothorax.

The heart is normal in size.

Median sternotomy.
IMPRESSION: No evidence of acute cardiopulmonary disease.
